# Patient Record
Sex: Male | Born: 1960 | Race: Black or African American | Hispanic: No | Marital: Single | Smoking: Never smoker
Health system: Southern US, Community
[De-identification: ages and names within clinical notes are randomized; demographics above are authoritative.]

## PROBLEM LIST (undated history)

## (undated) DIAGNOSIS — M25812 Other specified joint disorders, left shoulder: Secondary | ICD-10-CM

## (undated) DIAGNOSIS — N281 Cyst of kidney, acquired: Secondary | ICD-10-CM

## (undated) DIAGNOSIS — Z8673 Personal history of transient ischemic attack (TIA), and cerebral infarction without residual deficits: Secondary | ICD-10-CM

## (undated) DIAGNOSIS — I10 Essential (primary) hypertension: Secondary | ICD-10-CM

## (undated) DIAGNOSIS — Q2112 Patent foramen ovale: Secondary | ICD-10-CM

## (undated) DIAGNOSIS — N400 Enlarged prostate without lower urinary tract symptoms: Secondary | ICD-10-CM

## (undated) DIAGNOSIS — Q211 Atrial septal defect: Secondary | ICD-10-CM

## (undated) DIAGNOSIS — M1990 Unspecified osteoarthritis, unspecified site: Secondary | ICD-10-CM

## (undated) DIAGNOSIS — N183 Chronic kidney disease, stage 3 unspecified: Secondary | ICD-10-CM

## (undated) DIAGNOSIS — M75110 Incomplete rotator cuff tear or rupture of unspecified shoulder, not specified as traumatic: Secondary | ICD-10-CM

## (undated) DIAGNOSIS — E785 Hyperlipidemia, unspecified: Secondary | ICD-10-CM

## (undated) DIAGNOSIS — G4730 Sleep apnea, unspecified: Secondary | ICD-10-CM

## (undated) DIAGNOSIS — M7542 Impingement syndrome of left shoulder: Secondary | ICD-10-CM

## (undated) DIAGNOSIS — M109 Gout, unspecified: Secondary | ICD-10-CM

## (undated) DIAGNOSIS — J4520 Mild intermittent asthma, uncomplicated: Secondary | ICD-10-CM

## (undated) HISTORY — DX: Essential (primary) hypertension: I10

## (undated) HISTORY — DX: Sleep apnea, unspecified: G47.30

---

## 1982-07-28 HISTORY — PX: ABDOMINAL SURGERY: SHX537

## 2008-11-23 ENCOUNTER — Emergency Department (HOSPITAL_COMMUNITY): Admission: EM | Admit: 2008-11-23 | Discharge: 2008-11-23 | Payer: Self-pay | Admitting: Emergency Medicine

## 2009-08-14 ENCOUNTER — Encounter: Admission: RE | Admit: 2009-08-14 | Discharge: 2009-08-14 | Payer: Self-pay | Admitting: Internal Medicine

## 2009-08-22 ENCOUNTER — Emergency Department (HOSPITAL_BASED_OUTPATIENT_CLINIC_OR_DEPARTMENT_OTHER): Admission: EM | Admit: 2009-08-22 | Discharge: 2009-08-22 | Payer: Self-pay | Admitting: Emergency Medicine

## 2010-09-20 ENCOUNTER — Emergency Department (HOSPITAL_COMMUNITY): Payer: Self-pay

## 2010-09-20 ENCOUNTER — Inpatient Hospital Stay (HOSPITAL_COMMUNITY)
Admission: EM | Admit: 2010-09-20 | Discharge: 2010-09-23 | DRG: 066 | Disposition: A | Discharge status: Home / Self Care | Payer: Self-pay | Attending: Internal Medicine | Admitting: Internal Medicine

## 2010-09-20 DIAGNOSIS — I6350 Cerebral infarction due to unspecified occlusion or stenosis of unspecified cerebral artery: Principal | ICD-10-CM | POA: Diagnosis present

## 2010-09-20 DIAGNOSIS — Z7982 Long term (current) use of aspirin: Secondary | ICD-10-CM

## 2010-09-20 DIAGNOSIS — R29898 Other symptoms and signs involving the musculoskeletal system: Secondary | ICD-10-CM | POA: Diagnosis present

## 2010-09-20 LAB — DIFFERENTIAL
Basophils Absolute: 0 10*3/uL (ref 0.0–0.1)
Lymphocytes Relative: 52 % — ABNORMAL HIGH (ref 12–46)
Monocytes Relative: 8 % (ref 3–12)
Neutrophils Relative %: 37 % — ABNORMAL LOW (ref 43–77)

## 2010-09-20 LAB — CBC
MCV: 89.9 fL (ref 78.0–100.0)
RBC: 4.46 MIL/uL (ref 4.22–5.81)
RDW: 13.5 % (ref 11.5–15.5)
WBC: 5.5 10*3/uL (ref 4.0–10.5)

## 2010-09-20 LAB — BASIC METABOLIC PANEL
CO2: 25 mEq/L (ref 19–32)
Chloride: 103 mEq/L (ref 96–112)
Creatinine, Ser: 1.13 mg/dL (ref 0.4–1.5)
GFR calc Af Amer: >60 mL/min (ref >60)
Potassium: 3.6 mEq/L (ref 3.5–5.1)
Sodium: 137 mEq/L (ref 135–145)

## 2010-09-20 LAB — POCT CARDIAC MARKERS: Troponin i, poc: <0.05 ng/mL (ref 0.00–0.09)

## 2010-09-20 LAB — URINALYSIS, ROUTINE W REFLEX MICROSCOPIC
Bilirubin Urine: NEGATIVE
Hgb urine dipstick: NEGATIVE
Ketones, ur: NEGATIVE mg/dL
Protein, ur: NEGATIVE mg/dL
Specific Gravity, Urine: 1.021 (ref 1.005–1.030)
Urine Glucose, Fasting: NEGATIVE mg/dL
Urobilinogen, UA: 2 mg/dL — ABNORMAL HIGH (ref 0.0–1.0)
pH: 7 (ref 5.0–8.0)

## 2010-09-21 ENCOUNTER — Inpatient Hospital Stay (HOSPITAL_COMMUNITY): Payer: Self-pay

## 2010-09-21 LAB — COMPREHENSIVE METABOLIC PANEL
ALT: 21 U/L (ref 0–53)
Alkaline Phosphatase: 37 U/L — ABNORMAL LOW (ref 39–117)
Chloride: 107 mEq/L (ref 96–112)
GFR calc Af Amer: >60 mL/min (ref >60)
GFR calc non Af Amer: >60 mL/min (ref >60)
Potassium: 3.2 mEq/L — ABNORMAL LOW (ref 3.5–5.1)
Sodium: 141 mEq/L (ref 135–145)
Total Protein: 6.9 g/dL (ref 6.0–8.3)

## 2010-09-21 LAB — CBC
MCH: 32.3 pg (ref 26.0–34.0)
MCHC: 35.9 g/dL (ref 30.0–36.0)
RDW: 13.6 % (ref 11.5–15.5)

## 2010-09-21 LAB — CK TOTAL AND CKMB (NOT AT ARMC)
CK, MB: 3.8 ng/mL (ref 0.3–4.0)
Total CK: 500 U/L — ABNORMAL HIGH (ref 7–232)

## 2010-09-21 LAB — APTT: aPTT: 28 seconds (ref 24–37)

## 2010-09-21 LAB — LIPID PANEL: LDL Cholesterol: 102 mg/dL — ABNORMAL HIGH (ref 0–99)

## 2010-09-21 LAB — BASIC METABOLIC PANEL
Calcium: 9.2 mg/dL (ref 8.4–10.5)
Creatinine, Ser: 1.16 mg/dL (ref 0.4–1.5)
GFR calc Af Amer: >60 mL/min (ref >60)
GFR calc non Af Amer: >60 mL/min (ref >60)
Glucose, Bld: 119 mg/dL — ABNORMAL HIGH (ref 70–99)
Sodium: 139 mEq/L (ref 135–145)

## 2010-09-21 LAB — TROPONIN I: Troponin I: 0.01 ng/mL (ref 0.00–0.06)

## 2010-09-21 LAB — HEMOGLOBIN A1C: Hgb A1c MFr Bld: 5.7 % — ABNORMAL HIGH (ref <5.7)

## 2010-09-24 ENCOUNTER — Other Ambulatory Visit (HOSPITAL_COMMUNITY): Payer: Self-pay | Admitting: Internal Medicine

## 2010-09-30 NOTE — H&P (Signed)
[name], [name]               ACCOUNT NO.:  [id number]  MEDICAL RECORD NO.:  [id number]           PATIENT TYPE:  I  LOCATION:  3010                         FACILITY:  [facility]  PHYSICIAN:  Della Goo, M.D. DATE OF BIRTH:  Dec 07, 1960  DATE OF ADMISSION:  09/20/2010 DATE OF DISCHARGE:                             HISTORY & PHYSICAL   PRIMARY CARE PHYSICIAN:  None.  CHIEF COMPLAINT:  Dizziness.  HISTORY OF PRESENT ILLNESS:  This is a 50 year old male who presents to the emergency department second through the complaints of sudden onset of severe headaches and dizziness.  He reports he was at work when this happened and he was on break at the time.  The patient reports having dizziness along with ataxia and problems with his balance.  The patient was also reported having right-sided weakness.  PAST MEDICAL HISTORY:  None.  MEDICATIONS:  None.  ALLERGIES:  No known drug allergies.  SOCIAL HISTORY:  The patient is married.  He is nonsmoker and nondrinker and denies any illicit drug usage.  FAMILY HISTORY:  Negative for coronary artery disease, hypertension, diabetes, or cancer.  REVIEW OF SYSTEMS:  Pertinent as mentioned above.  PHYSICAL EXAMINATION FINDINGS:  GENERAL:  This is a 50 year old well- nourished, well-developed, African American male who is in no acute distress. VITAL SIGNS:  Temperature 97.9, blood pressure 143/83, heart rate 61, respirations 18, O2 sat 98%. HEENT:  Normocephalic, atraumatic.  There is no facial drooping.  Pupils are equally round and reactive to light bilaterally. CARDIOVASCULAR: Regular rate and rhythm.  No murmurs, gallops or rubs. LUNGS:  Clear to auscultation bilaterally.  No rales, no rhonchi and no wheezes. ABDOMEN:  Positive bowel sounds, soft, nontender, and nondistended.  No hepatosplenomegaly. EXTREMITIES:  Without cyanosis, clubbing or edema. NEUROLOGIC:  The patient is alert and oriented x3.  His cranial nerves are  intact.  There is no pronator drifting.  Bilateral grip strength symmetric and 5/5.  The patient is able to move all four of his extremities and there are no focal deficits on examination.  LABORATORY STUDIES:  White blood cell count 5.5, hemoglobin 14.4, hematocrit 40.1, MCV 89.9, platelets are 166, neutrophils 37%, lymphocytes 52%, which are negative.  Chemistry with a sodium of 137, potassium 3.6, chloride 103, CO2 25, BUN 17, creatinine 1.13, and glucose 95.  Urinalysis negative.  Urine drug screen negative.  X-ray reveals no acute disease process.  ECG reveals normal sinus rhythm.  No acute ST-segment changes.  CT scan of the brain without contrast reveals a right cerebellar infarct.  ASSESSMENT:  A 50 year old male being admitted with 1. Right cerebellar infarct. 2. Dizziness. 3. Ataxia.  PLAN:  The patient will be admitted to telemetry area for monitoring. Neurologic checks will be performed and cardiac enzymes.  CVA protocol has been initiated.  The patient will also have his fasting lipids checked.  Blood pressures also will be monitored, and physical therapy and occupational therapy consultations will be placed.     Della Goo, M.D.     HJ/MEDQ  D:  09/21/2010  T:  09/21/2010  Job:  [id number]  Electronically Signed by  Della Goo M.D. on 09/30/2010 04:54:09 PM

## 2010-10-03 NOTE — Discharge Summary (Signed)
[name], [name]               ACCOUNT NO.:  [id number]  MEDICAL RECORD NO.:  [id number]           PATIENT TYPE:  I  LOCATION:  3010                         FACILITY:  [facility]  PHYSICIAN:  Baltazar Najjar, MD     DATE OF BIRTH:  04/10/1961  DATE OF ADMISSION:  09/20/2010 DATE OF DISCHARGE:  09/23/2010                              DISCHARGE SUMMARY   FINAL DISCHARGE DIAGNOSES: 1. Acute right cerebellar infarct. 2. Ataxia/dizziness secondary to acute right cerebellar infarct.  CONSULTATION:  During this hospitalization, [facility].  The patient was seen by Dr. Anne Hahn.  RADIOLOGY/IMAGING: 1. MRI of brain done on September 21, 2010, showed 2 cm region of acute     infarction affecting the superior cerebellum on the right,     otherwise normal. 2. MRA of head showed no major vessel occlusion or correctable     proximal stenosis.  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 3. CT of head showed normal CT.  BRIEF ADMITTING HISTORY:  Please refer to the H and P for more details. On summary, Mr. [name] is a 50 year old African-American man with no significant past medical history presented with chief complaint of severe headache and dizziness while he was at work.  He also had right- sided weakness.  HOSPITAL COURSE:  The patient had MRI done in the ED that showed right cerebellar infarct.  He was admitted to the Neurology Service, was evaluated by Dr. Anne Hahn from Neurology, started on aspirin and Zocor. The patient was seen by PT, OT, and ST during this hospitalization. There was no need for PT, OT, or ST recommendation.  The patient regained his balance and there was no motor deficiency or rigidity noted.  He was seen by Dr. Anne Hahn today, who recommended outpatient transcranial Doppler bubble study and MRA of neck as an outpatient.   The patient had an echocardiogram, which was unremarkable; however, his carotid Doppler is still pending at the time of dictation.  The patient was seen and examined by me today and he is stable for discharge and eager to go home today.  We will discharge home.  If his carotid Doppler is negative, to follow with Dr. Anne Hahn for the rest of studies to be done as an outpatient.  DISCHARGE MEDICATIONS: 1. Aspirin 325 mg 1 tablet p.o. daily. 2. Zocor 20 mg p.o. daily.  DISCHARGE INSTRUCTIONS: 1. The patient to continue above medications as prescribed. 2. The patient to follow with his PCP within 1 week.  Follow with     Neurology Service as recommended by Dr. Earlene Plater in 2 weeks. 3. The patient to report any worsening of symptoms or any new symptoms     to his PCP or to come back to the ED.  CONDITION ON DISCHARGE:  Stable/improved.  Noted that the patient has no PCP and will request case manager to assign a PCP for him prior to discharge.          ______________________________ Baltazar Najjar,  MD     SA/MEDQ  D:  09/23/2010  T:  09/23/2010  Job:  [id number]  Electronically Signed by Hannah Beat MD on 10/02/2010 09:29:54 PM

## 2010-10-07 ENCOUNTER — Other Ambulatory Visit: Payer: Self-pay | Admitting: Neurology

## 2010-10-07 DIAGNOSIS — I6322 Cerebral infarction due to unspecified occlusion or stenosis of basilar arteries: Secondary | ICD-10-CM

## 2010-10-07 DIAGNOSIS — I6350 Cerebral infarction due to unspecified occlusion or stenosis of unspecified cerebral artery: Secondary | ICD-10-CM

## 2010-10-14 LAB — WOUND CULTURE: Gram Stain: NONE SEEN

## 2010-10-15 ENCOUNTER — Other Ambulatory Visit: Payer: Self-pay

## 2010-10-29 NOTE — Consult Note (Signed)
[name], [name]               ACCOUNT NO.:  [id number]  MEDICAL RECORD NO.:  [id number]           PATIENT TYPE:  I  LOCATION:  3010                         FACILITY:  [facility]  PHYSICIAN:  Marlan Palau, M.D.  DATE OF BIRTH:  09/08/1960  DATE OF CONSULTATION:  09/23/2010 DATE OF DISCHARGE:  09/23/2010                                CONSULTATION   REASON FOR CONSULTATION:  Dizziness, right-sided weakness, and right punctate cerebellar infarct on MRI.  NIH stroke scale 0.  Modified Rankin 0.  CT was not given as the patient had delay in arrival.  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  He went to the break room at approximately 5:30, sat down for a 15-minute break.  As he tried to get up, he noted that it was very hard for him to get his balance and he started to sway to the right.  As he tried to walk, he noted he has significant weakness in his right leg and continued to veer to the right.  He then tried to pick up a couple water with his right hand and noted that he had no strength in his right hand either.  He states that he walked along the wall after little while until coworker came.  He waited for approximately 1 hour before he went to his supervisor's office who then called EMS.  At the time of arrival he states EMS' blood pressure showed that he was systolically above 200 and diastolically in the mid 100s. The patient was brought immediately to [facility] ED where No Code Stroke was called and the patient was 2 hours out of initial symptoms. The patient states upon hitting the arrival of the ED, he continued to have to right arm tingling and right leg weakness.  It is not documented why code stroke was not called at that time as the patient was only 2 hours out of the window.  The patient states by the following day his symptoms had fully  resolved.  The patient was admitted on September 20, 2010.  Neurology was consulted on September 23, 2010.  At this time, the patient's symptoms have fully resolved.  The patient was on no medication at the time of arrival and has now been placed on aspirin and Zocor.  PAST MEDICAL HISTORY:  None.  STROKE RISK FACTORS:  Hypertension.  MEDICATIONS:  The patient was on none coming to the hospital; however, he is on aspirin 325 daily, Zocor 20 mg, and Zofran.  ALLERGIES:  No known drug allergies.  FAMILY HISTORY:  Negative for CAD, hypertension, diabetes, and cancer.  SOCIAL HISTORY:  He is married.  Does not smoke or drink.  REVIEW OF SYSTEMS:  Positive for right-sided weakness, dizziness, off balance, and hypertension.  PHYSICAL EXAMINATION:  VITAL SIGNS:  Temperature is 98.9, blood pressure is 136/85, heart rate 63, respirations 18. LUNGS:  Clear to auscultation bilaterally. NECK:  Negative bruits and supple. CARDIOVASCULAR:  Regular rate and rhythm. ABDOMEN:  Soft,  nontender, and nondistended. SKIN:  Warm, dry, and intact. NEUROLOGIC:  Pupils are equal, reactive, and accommodating.  Extraocular movements are intact.  Tongue is midline.  Face is equal, symmetrical. Speech is clear, well-enunciated.  Sensation is intact to pinprick, light touch, and vibration bilaterally.  Strength is 5/5 throughout with no drift on the upper or lower extremities.  Finger-nose, heel-to-shin were smooth.  Deep tendon reflexes were 1+ throughout.  Downgoing toes bilaterally.  Vision was intact to double simultaneous stimuli.  LABORATORY RESULTS:  Sodium 139, potassium 3.4, chloride 106, CO2 of 27, BUN is 15, creatinine 1.16, glucose 119.  White blood cell count 4.6, hemoglobin is 13.9, and hemoglobin 38.7, platelets 141.  PT is 13.8, INR is 1.07.  HbA1c is 5.7, cholesterol 148, triglycerides 92, HDL 28, LDL 102.  EKG is normal sinus rhythm.  CT head was negative for any mass or bleed.  MRI head  showed a 2-cm region of acute infarct in the right superior cerebellum.  MRA of the head showed possible stenosis right superior cerebellar artery at 2.5 cm beyond origin.  A 2-D echo and carotid Dopplers are pending.  ASSESSMENT:  This is a pleasant 50 year old male with sudden onset of right-sided weakness and unsteadiness, now showing a right cerebellar infarct.  Most likely etiology is hypertension with small vessel disease.  At this time, recommendations are, 1. Continue the patient on aspirin 325 mg daily. 2. Obtain carotid Doppler and 2-D echo as planned. 3. Continue with Zocor. 4. PT and OT. 5. Follow up with Dr. Pearlean Brownie in 2-3 months.  I will discuss this with Dr. Anne Hahn and make any changes that is needed.     Felicie Morn, PA-C   ______________________________ C. Lesia Sago, M.D.    DS/MEDQ  D:  09/23/2010  T:  09/23/2010  Job:  [id number]  Electronically Signed by Felicie Morn PA-C on 09/26/2010 01:27:18 PM Electronically Signed by Thana Farr M.D. on 10/29/2010 08:26:32 AM

## 2010-12-05 ENCOUNTER — Telehealth: Payer: Self-pay | Admitting: Cardiology

## 2010-12-05 NOTE — Telephone Encounter (Signed)
Per Dr. Pearlean Brownie with [facility], patient needed to be set up for a transesophageal echocardiogram. I have scheduled him for 12/09/10 @ 11:00am. I left a message for patient to call back and confirm appointment.

## 2010-12-05 NOTE — Telephone Encounter (Signed)
lvmtcb

## 2010-12-06 NOTE — Telephone Encounter (Signed)
Pt aware of his TEE appointment Monday morning @ 11:00am to be there at 10:00 am. He is aware to be NPO after midnight.

## 2010-12-07 ENCOUNTER — Emergency Department (HOSPITAL_BASED_OUTPATIENT_CLINIC_OR_DEPARTMENT_OTHER)
Admission: EM | Admit: 2010-12-07 | Discharge: 2010-12-07 | Disposition: A | Discharge status: Home / Self Care | Payer: Managed Care, Other (non HMO) | Attending: Emergency Medicine | Admitting: Emergency Medicine

## 2010-12-07 DIAGNOSIS — Z8679 Personal history of other diseases of the circulatory system: Secondary | ICD-10-CM | POA: Insufficient documentation

## 2010-12-07 DIAGNOSIS — E7800 Pure hypercholesterolemia, unspecified: Secondary | ICD-10-CM | POA: Insufficient documentation

## 2010-12-07 DIAGNOSIS — M7130 Other bursal cyst, unspecified site: Secondary | ICD-10-CM | POA: Insufficient documentation

## 2010-12-09 ENCOUNTER — Ambulatory Visit (HOSPITAL_COMMUNITY)
Admission: RE | Admit: 2010-12-09 | Discharge: 2010-12-09 | Disposition: A | Discharge status: Home / Self Care | Payer: Managed Care, Other (non HMO) | Source: Ambulatory Visit | Attending: Internal Medicine | Admitting: Internal Medicine

## 2010-12-09 DIAGNOSIS — Q2111 Secundum atrial septal defect: Secondary | ICD-10-CM | POA: Insufficient documentation

## 2010-12-09 DIAGNOSIS — Z8673 Personal history of transient ischemic attack (TIA), and cerebral infarction without residual deficits: Secondary | ICD-10-CM | POA: Insufficient documentation

## 2010-12-09 DIAGNOSIS — Q211 Atrial septal defect: Secondary | ICD-10-CM | POA: Insufficient documentation

## 2010-12-09 DIAGNOSIS — I6789 Other cerebrovascular disease: Secondary | ICD-10-CM

## 2010-12-09 HISTORY — PX: TRANSESOPHAGEAL ECHOCARDIOGRAM: SHX273

## 2010-12-11 ENCOUNTER — Telehealth: Payer: Self-pay | Admitting: Internal Medicine

## 2010-12-11 NOTE — Telephone Encounter (Signed)
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

## 2010-12-12 NOTE — Telephone Encounter (Signed)
LMOM for call back. 

## 2010-12-12 NOTE — Telephone Encounter (Signed)
[name] is returning your call.

## 2010-12-12 NOTE — Telephone Encounter (Signed)
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 She advised me that she had a copy of his discharge paperwork that states not to operate machinery that day. I advised her that this also means not to work that day. She verbalized understanding.

## 2010-12-13 ENCOUNTER — Telehealth: Payer: Self-pay | Admitting: Internal Medicine

## 2010-12-13 NOTE — Telephone Encounter (Signed)
Pt calling to get results from test on monday

## 2010-12-13 NOTE — Telephone Encounter (Signed)
Called patient back. He wanted to know what the next step is for him after having the TEE on Monday. Advised him per Dr.Ross to follow up with [facility]. He will call them on Monday.

## 2010-12-16 ENCOUNTER — Telehealth: Payer: Self-pay | Admitting: Internal Medicine

## 2010-12-16 NOTE — Telephone Encounter (Signed)
Pt Walked in Dropped off Attending Physician Forms for completion by Dr.P Tenny Craw, I will hold at my desk until Annice Pih comes in Office 12/17/10. 12/16/10/km

## 2010-12-17 ENCOUNTER — Telehealth: Payer: Self-pay | Admitting: Internal Medicine

## 2010-12-17 NOTE — Telephone Encounter (Signed)
Pt requesting fmla be faxed to [phone] att judy fields at [employer]

## 2010-12-19 NOTE — Telephone Encounter (Signed)
Unable to leave a message at cell or work phone because of lack of ID. 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  Rep from HR dept called back and I advised that Dr.Ross will not fill out FMLA paperwork because she only did a TEE on this patient. Form needs to be completed by Dr.Sethi at [facility].

## 2011-03-04 ENCOUNTER — Encounter: Payer: Self-pay | Admitting: *Deleted

## 2011-03-04 ENCOUNTER — Emergency Department (HOSPITAL_BASED_OUTPATIENT_CLINIC_OR_DEPARTMENT_OTHER)
Admission: EM | Admit: 2011-03-04 | Discharge: 2011-03-04 | Disposition: A | Discharge status: Home / Self Care | Payer: Managed Care, Other (non HMO) | Attending: Emergency Medicine | Admitting: Emergency Medicine

## 2011-03-04 DIAGNOSIS — R21 Rash and other nonspecific skin eruption: Secondary | ICD-10-CM | POA: Insufficient documentation

## 2011-03-04 DIAGNOSIS — T7840XA Allergy, unspecified, initial encounter: Secondary | ICD-10-CM

## 2011-03-04 DIAGNOSIS — Z8679 Personal history of other diseases of the circulatory system: Secondary | ICD-10-CM | POA: Insufficient documentation

## 2011-03-04 DIAGNOSIS — R220 Localized swelling, mass and lump, head: Secondary | ICD-10-CM | POA: Insufficient documentation

## 2011-03-04 MED ORDER — DIPHENHYDRAMINE HCL 50 MG/ML IJ SOLN
50.0000 mg | Freq: Once | INTRAMUSCULAR | Status: AC
  Administered 2011-03-04: 50 mg via INTRAVENOUS
  Filled 2011-03-04: qty 1

## 2011-03-04 MED ORDER — FAMOTIDINE IN NACL 20-0.9 MG/50ML-% IV SOLN
20.0000 mg | Freq: Once | INTRAVENOUS | Status: AC
  Administered 2011-03-04: 20 mg via INTRAVENOUS
  Filled 2011-03-04: qty 50

## 2011-03-04 MED ORDER — PREDNISONE 10 MG PO TABS: ORAL_TABLET | ORAL | Status: DC

## 2011-03-04 MED ORDER — DIPHENHYDRAMINE HCL 25 MG PO TABS: 25.0000 mg | ORAL_TABLET | Freq: Four times a day (QID) | ORAL | Status: AC

## 2011-03-04 MED ORDER — METHYLPREDNISOLONE SODIUM SUCC 125 MG IJ SOLR
125.0000 mg | Freq: Once | INTRAMUSCULAR | Status: AC
  Administered 2011-03-04: 125 mg via INTRAVENOUS
  Filled 2011-03-04: qty 2

## 2011-03-04 NOTE — ED Notes (Signed)
NO resp distress noted. Pt does have left sided upper lip swelling. Pt reports rash to arms and back x1 day. Wife states pt has been using a new laundry detergent for approx 1 week now. Pt denies any other known allergen.

## 2011-03-04 NOTE — ED Notes (Signed)
Pt alert and oriented. Denies difficulty breathing or airway tightening. DC instructions given to girlfriend, who verbalized understanding of continued care.

## 2011-03-04 NOTE — ED Notes (Signed)
Rash began earlier today to back/arms. Then lip swelling with tightening of throat began tonight approx 2-3hours ago. Has progressively worsened. Denies any known exposure to allergen.

## 2011-03-04 NOTE — ED Provider Notes (Addendum)
History    Scribed for Suzi Roots, MD, the patient was seen in room [facility]. This chart was scribed by Clarita Crane. This patient's care was started at 12:25PM.  CSN: [id number] Arrival date & time: 03/04/2011 12:14 AM  Chief Complaint  Patient presents with  . Allergic Reaction   HPI Patient is a 50 year old male c/o allergic reaction with new onset rash to bilateral upper extremities and back with associated pruritus this afternoon and moderate swelling of upper lip and throat tonight while at work. States swelling, rash and pruritus has been worsening since onset. Patient denies recent new exposures and states he is not currently taking any medications. Denies tongue swelling, SOB, wheezing, chest pain. No other complaints. Pt denies any new home or personal products. No change in foods, no shellfish. No personal or fam hx or facial or tongue swelling or angioedema. No medication use of any sort including bp meds. No fever or chills. No uri c/o. No recent viral or febrile illness.   PAST MEDICAL HISTORY:  Past Medical History  Diagnosis Date  . Stroke     PAST SURGICAL HISTORY:  History reviewed. No pertinent past surgical history.  MEDICATIONS:  Previous Medications   No medications on file     ALLERGIES:  Allergies as of 03/04/2011  . (No Known Allergies)     FAMILY HISTORY:  No family history on file.   SOCIAL HISTORY: History   Social History  . Marital Status: Single    Spouse Name: N/A    Number of Children: N/A  . Years of Education: N/A   Social History Main Topics  . Smoking status: Never Smoker   . Smokeless tobacco: None  . Alcohol Use: No  . Drug Use: No  . Sexually Active:    Other Topics Concern  . None   Social History Narrative  . None     Review of Systems 10 Systems reviewed and are negative for acute change except as noted in the HPI.  Physical Exam  BP 141/80  Pulse 73  Temp(Src) 99.7 F (37.6 C) (Oral)  Resp 18  Ht 6'  (1.829 m)  Wt 225 lb (102.059 kg)  BMI 30.52 kg/m2  SpO2 98%  Physical Exam  Nursing note and vitals reviewed. Constitutional: He is oriented to person, place, and time. He appears well-developed and well-nourished.  HENT:  Head: Normocephalic and atraumatic.       No swelling of tongue or throat appreciated. Moderate swelling of left side of upper lip.   Eyes: Conjunctivae are normal. Pupils are equal, round, and reactive to light.  Neck: Normal range of motion. Neck supple.  Cardiovascular: Normal rate, regular rhythm and normal heart sounds.  Exam reveals no gallop and no friction rub.   No murmur heard.      Hypertensive.   Pulmonary/Chest: Effort normal and breath sounds normal. He has no wheezes. He has no rales.       No stridor  Abdominal: Soft. There is no tenderness.  Musculoskeletal: Normal range of motion.  Neurological: He is alert and oriented to person, place, and time. No sensory deficit.  Skin: Skin is warm and dry.  Psychiatric: He has a normal mood and affect. His behavior is normal.    ED Course  Procedures  MDM Iv ns. Solumedrol iv, benadryl iv, and pepcid iv.   Observed in ed. Pt feels improved. No trouble breathing or swallowing. Lip swelling slowly improving. No throat swelling or edema  noted on recheck. Discussed plan for rx and allergy referral/follow up.     I personally performed the services described in this documentation, which was scribed in my presence. The recorded information has been reviewed and considered. Suzi Roots, MD    Suzi Roots, MD 03/04/11 [id number]  Suzi Roots, MD 03/04/11 [phone]

## 2011-03-04 NOTE — ED Notes (Signed)
Pt states the throat tightening has subsided "alot". sats 100% on RA. Lips remain swollen.

## 2011-09-25 ENCOUNTER — Encounter (HOSPITAL_BASED_OUTPATIENT_CLINIC_OR_DEPARTMENT_OTHER): Payer: Self-pay | Admitting: *Deleted

## 2011-09-25 DIAGNOSIS — J40 Bronchitis, not specified as acute or chronic: Secondary | ICD-10-CM | POA: Insufficient documentation

## 2011-09-25 DIAGNOSIS — R05 Cough: Secondary | ICD-10-CM | POA: Insufficient documentation

## 2011-09-25 DIAGNOSIS — R059 Cough, unspecified: Secondary | ICD-10-CM | POA: Insufficient documentation

## 2011-09-25 DIAGNOSIS — R062 Wheezing: Secondary | ICD-10-CM | POA: Insufficient documentation

## 2011-09-25 NOTE — ED Notes (Signed)
Pt states that he has had a nonproductive  cough x two weeks also reports dizziness with coughing

## 2011-09-26 ENCOUNTER — Emergency Department (INDEPENDENT_AMBULATORY_CARE_PROVIDER_SITE_OTHER)
Admit: 2011-09-26 | Discharge: 2011-09-26 | Disposition: A | Discharge status: Home / Self Care | Payer: Managed Care, Other (non HMO) | Attending: Emergency Medicine | Admitting: Emergency Medicine

## 2011-09-26 ENCOUNTER — Emergency Department (HOSPITAL_BASED_OUTPATIENT_CLINIC_OR_DEPARTMENT_OTHER)
Admission: EM | Admit: 2011-09-26 | Discharge: 2011-09-26 | Disposition: A | Discharge status: Home / Self Care | Payer: Managed Care, Other (non HMO) | Attending: Emergency Medicine | Admitting: Emergency Medicine

## 2011-09-26 DIAGNOSIS — J40 Bronchitis, not specified as acute or chronic: Secondary | ICD-10-CM

## 2011-09-26 DIAGNOSIS — R55 Syncope and collapse: Secondary | ICD-10-CM

## 2011-09-26 DIAGNOSIS — R059 Cough, unspecified: Secondary | ICD-10-CM

## 2011-09-26 DIAGNOSIS — R05 Cough: Secondary | ICD-10-CM

## 2011-09-26 DIAGNOSIS — R062 Wheezing: Secondary | ICD-10-CM

## 2011-09-26 DIAGNOSIS — R079 Chest pain, unspecified: Secondary | ICD-10-CM

## 2011-09-26 DIAGNOSIS — R0602 Shortness of breath: Secondary | ICD-10-CM

## 2011-09-26 MED ORDER — IPRATROPIUM BROMIDE 0.02 % IN SOLN
0.5000 mg | Freq: Once | RESPIRATORY_TRACT | Status: AC
  Administered 2011-09-26: 0.5 mg via RESPIRATORY_TRACT
  Filled 2011-09-26: qty 2.5

## 2011-09-26 MED ORDER — AZITHROMYCIN 250 MG PO TABS
500.0000 mg | ORAL_TABLET | Freq: Once | ORAL | Status: AC
  Administered 2011-09-26: 500 mg via ORAL
  Filled 2011-09-26: qty 2

## 2011-09-26 MED ORDER — PREDNISONE 50 MG PO TABS
60.0000 mg | ORAL_TABLET | Freq: Once | ORAL | Status: AC
  Administered 2011-09-26: 60 mg via ORAL
  Filled 2011-09-26: qty 1

## 2011-09-26 MED ORDER — PREDNISONE 20 MG PO TABS: 60.0000 mg | ORAL_TABLET | Freq: Every day | ORAL | Status: AC

## 2011-09-26 MED ORDER — IPRATROPIUM BROMIDE 0.02 % IN SOLN: 0.5000 mg | Freq: Once | RESPIRATORY_TRACT | Status: DC

## 2011-09-26 MED ORDER — AZITHROMYCIN 250 MG PO TABS: ORAL_TABLET | ORAL | Status: AC

## 2011-09-26 MED ORDER — ALBUTEROL SULFATE (5 MG/ML) 0.5% IN NEBU
5.0000 mg | INHALATION_SOLUTION | Freq: Once | RESPIRATORY_TRACT | Status: AC
  Administered 2011-09-26: 5 mg via RESPIRATORY_TRACT
  Filled 2011-09-26: qty 1

## 2011-09-26 MED ORDER — ALBUTEROL SULFATE HFA 108 (90 BASE) MCG/ACT IN AERS: 2.0000 | INHALATION_SPRAY | RESPIRATORY_TRACT | Status: DC | PRN

## 2011-09-26 MED ORDER — ALBUTEROL SULFATE (5 MG/ML) 0.5% IN NEBU: 5.0000 mg | INHALATION_SOLUTION | Freq: Once | RESPIRATORY_TRACT | Status: DC

## 2011-09-26 NOTE — ED Notes (Signed)
Pt. Alert and oriented, ambulatory gait steady, respirations even and regular, NAD noted

## 2011-09-26 NOTE — Discharge Instructions (Signed)
Take antibiotic as prescribed. Take prednisone as prescribed. Use albuterol inhaler 2 puffs every 4 hours as need for wheezing. Follow up with primary care doctor in the next few days. Return to ER if worse, trouble breathing, chest pain, weak/faint, other concern.    Bronchitis Bronchitis is the body's way of reacting to injury and/or infection (inflammation) of the bronchi. Bronchi are the air tubes that extend from the windpipe into the lungs. If the inflammation becomes severe, it may cause shortness of breath. CAUSES  Inflammation may be caused by:  A virus.   Germs (bacteria).   Dust.   Allergens.   Pollutants and many other irritants.  The cells lining the bronchial tree are covered with tiny hairs (cilia). These constantly beat upward, away from the lungs, toward the mouth. This keeps the lungs free of pollutants. When these cells become too irritated and are unable to do their job, mucus begins to develop. This causes the characteristic cough of bronchitis. The cough clears the lungs when the cilia are unable to do their job. Without either of these protective mechanisms, the mucus would settle in the lungs. Then you would develop pneumonia. Smoking is a common cause of bronchitis and can contribute to pneumonia. Stopping this habit is the single most important thing you can do to help yourself. TREATMENT   Your caregiver may prescribe an antibiotic if the cough is caused by bacteria. Also, medicines that open up your airways make it easier to breathe. Your caregiver may also recommend or prescribe an expectorant. It will loosen the mucus to be coughed up. Only take over-the-counter or prescription medicines for pain, discomfort, or fever as directed by your caregiver.   Removing whatever causes the problem (smoking, for example) is critical to preventing the problem from getting worse.   Cough suppressants may be prescribed for relief of cough symptoms.   Inhaled medicines may be  prescribed to help with symptoms now and to help prevent problems from returning.   For those with recurrent (chronic) bronchitis, there may be a need for steroid medicines.  SEEK IMMEDIATE MEDICAL CARE IF:   During treatment, you develop more pus-like mucus (purulent sputum).   You have a fever.   Your baby is older than 3 months with a rectal temperature of 102 F (38.9 C) or higher.   Your baby is 44 months old or younger with a rectal temperature of 100.4 F (38 C) or higher.   You become progressively more ill.   You have increased difficulty breathing, wheezing, or shortness of breath.  It is necessary to seek immediate medical care if you are elderly or sick from any other disease. MAKE SURE YOU:   Understand these instructions.   Will watch your condition.   Will get help right away if you are not doing well or get worse.  Document Released: 07/14/2005 Document Revised: 03/26/2011 Document Reviewed: 05/23/2008 [facility] Patient Information 2012 [place], [place].     Bronchospasm A bronchospasm is when the tubes that carry air in and out of your lungs (bronchioles) become smaller. It is hard to breathe when this happens. A bronchospasm can be caused by:  Asthma.   Allergies.   Lung infection.  HOME CARE   Do not  smoke. Avoid places that have secondhand smoke.   Dust your house often. Have your air ducts cleaned once or twice a year.   Find out what allergies may cause your bronchospasms.   Use your inhaler properly if you have  one. Know when to use it.   Eat healthy foods and drink plenty of water.   Only take medicine as told by your doctor.  GET HELP RIGHT AWAY IF:  You feel you cannot breathe or catch your breath.   You cannot stop coughing.   Your treatment is not helping you breathe better.  MAKE SURE YOU:   Understand these instructions.   Will watch your condition.   Will get help right away if you are not doing well or get worse.    Document Released: 05/11/2009 Document Revised: 03/26/2011 Document Reviewed: 05/11/2009 [facility] Patient Information 2012 [place], [place].

## 2011-09-26 NOTE — ED Provider Notes (Signed)
History     CSN: [id number]  Arrival date & time 09/25/11  2338   None     Chief Complaint  Patient presents with  . Cough    (Consider location/radiation/quality/duration/timing/severity/associated sxs/prior treatment) Patient is a 51 y.o. male presenting with cough. The history is provided by the patient and the spouse.  Cough Associated symptoms include wheezing. Pertinent negatives include no chest pain, no headaches, no sore throat, no shortness of breath and no eye redness.  pt with non prod cough x 2 weeks. States not getting any better. No known ill contacts. No sore throat, runny nose or other uri c/o. ?fever. No chills/sweats.+ hx bronchitis/pna.  No chest pain or discomfort. No orthopnea or pnd. No leg pain or swelling. No hx cad, chf, or pe. Denies any hx inhaler use, no hx asthma. Non smoker. Denies allergies. No recent new meds.   Past Medical History  Diagnosis Date  . Stroke     History reviewed. No pertinent past surgical history.  History reviewed. No pertinent family history.  History  Substance Use Topics  . Smoking status: Never Smoker   . Smokeless tobacco: Not on file  . Alcohol Use: No      Review of Systems  Constitutional: Negative for fever.  HENT: Negative for sore throat, neck pain and neck stiffness.   Eyes: Negative for redness.  Respiratory: Positive for cough and wheezing. Negative for shortness of breath.   Cardiovascular: Negative for chest pain and leg swelling.  Gastrointestinal: Negative for abdominal pain.  Genitourinary: Negative for flank pain.  Musculoskeletal: Negative for back pain.  Skin: Negative for rash.  Neurological: Negative for headaches.  Hematological: Does not bruise/bleed easily.  Psychiatric/Behavioral: Negative for confusion.    Allergies  Review of patient's allergies indicates no known allergies.  Home Medications   Current Outpatient Rx  Name Route Sig Dispense Refill  . PREDNISONE 10 MG PO TABS   Three (3) tablets po once a day for next 2 days      BP 155/84  Pulse 68  Temp(Src) 98.3 F (36.8 C) (Oral)  Resp 16  SpO2 94%  Physical Exam  Nursing note and vitals reviewed. Constitutional: He is oriented to person, place, and time. He appears well-developed and well-nourished. No distress.  HENT:  Head: Atraumatic.  Mouth/Throat: Oropharynx is clear and moist.  Eyes: Pupils are equal, round, and reactive to light.  Neck: Neck supple. No tracheal deviation present.  Cardiovascular: Normal rate, regular rhythm, normal heart sounds and intact distal pulses.  Exam reveals no gallop and no friction rub.   No murmur heard. Pulmonary/Chest: Effort normal. No accessory muscle usage. No respiratory distress. He has wheezes. He has no rales.  Abdominal: Soft. There is no tenderness.  Musculoskeletal: Normal range of motion. He exhibits no edema and no tenderness.  Neurological: He is alert and oriented to person, place, and time.  Skin: Skin is warm and dry.  Psychiatric: He has a normal mood and affect.    ED Course  Procedures (including critical care time)  Labs Reviewed - No data to display Dg Chest 2 View  09/26/2011  *RADIOLOGY REPORT*  Clinical Data: Mid chest pain and cough; shortness of breath. Syncope.  CHEST - 2 VIEW  Comparison: Chest radiograph performed 09/20/2010  Findings: The lungs are well-aerated.  Mild peribronchial thickening is noted.  There is no evidence of focal opacification, pleural effusion or pneumothorax.  The heart is normal in size; the mediastinal contour is within  normal limits.  No acute osseous abnormalities are seen.  IMPRESSION: Mild peribronchial thickening noted; lungs otherwise clear.  Original Report Authenticated By: Tonia Ghent, M.D.        MDM  Alb and atrovent neb. Hx bronchitis/pna. Will rx abx, albuterol.   Recheck persistent wheezing. Albuterol and atrovent neb. pred po.    Recheck post 3rd neb. Good air exchange. No increased  wob. Pulse ox 95% room air.     Suzi Roots, MD 09/26/11 [phone]

## 2011-09-26 NOTE — ED Notes (Signed)
Received pt. From triage, pt. Alert and oriented, NAD noted,

## 2011-11-15 ENCOUNTER — Encounter (HOSPITAL_COMMUNITY): Payer: Self-pay | Admitting: Emergency Medicine

## 2011-11-15 ENCOUNTER — Emergency Department (HOSPITAL_COMMUNITY): Payer: Managed Care, Other (non HMO)

## 2011-11-15 ENCOUNTER — Emergency Department (HOSPITAL_COMMUNITY)
Admission: EM | Admit: 2011-11-15 | Discharge: 2011-11-15 | Disposition: A | Discharge status: Home / Self Care | Payer: Managed Care, Other (non HMO) | Attending: Emergency Medicine | Admitting: Emergency Medicine

## 2011-11-15 DIAGNOSIS — Z8673 Personal history of transient ischemic attack (TIA), and cerebral infarction without residual deficits: Secondary | ICD-10-CM | POA: Insufficient documentation

## 2011-11-15 DIAGNOSIS — X58XXXA Exposure to other specified factors, initial encounter: Secondary | ICD-10-CM | POA: Insufficient documentation

## 2011-11-15 DIAGNOSIS — R220 Localized swelling, mass and lump, head: Secondary | ICD-10-CM | POA: Insufficient documentation

## 2011-11-15 DIAGNOSIS — S0003XA Contusion of scalp, initial encounter: Secondary | ICD-10-CM | POA: Insufficient documentation

## 2011-11-15 DIAGNOSIS — R51 Headache: Secondary | ICD-10-CM | POA: Insufficient documentation

## 2011-11-15 DIAGNOSIS — R221 Localized swelling, mass and lump, neck: Secondary | ICD-10-CM | POA: Insufficient documentation

## 2011-11-15 MED ORDER — METOCLOPRAMIDE HCL 5 MG/ML IJ SOLN
10.0000 mg | Freq: Once | INTRAMUSCULAR | Status: AC
  Administered 2011-11-15: 10 mg via INTRAVENOUS
  Filled 2011-11-15: qty 2

## 2011-11-15 MED ORDER — SODIUM CHLORIDE 0.9 % IV BOLUS (SEPSIS)
1000.0000 mL | Freq: Once | INTRAVENOUS | Status: AC
  Administered 2011-11-15: 1000 mL via INTRAVENOUS

## 2011-11-15 MED ORDER — DIPHENHYDRAMINE HCL 50 MG/ML IJ SOLN
25.0000 mg | Freq: Once | INTRAMUSCULAR | Status: AC
  Administered 2011-11-15: 25 mg via INTRAVENOUS
  Filled 2011-11-15: qty 1

## 2011-11-15 NOTE — ED Provider Notes (Signed)
History     CSN: [id number]  Arrival date & time 11/15/11  1009   First MD Initiated Contact with Patient 11/15/11 1056      Chief Complaint  Patient presents with  . Headache    Knot on head    (Consider location/radiation/quality/duration/timing/severity/associated sxs/prior treatment) Patient is a 51 y.o. male presenting with headaches. The history is provided by the patient.  Headache   He woke up this morning with a right sided occipital headache which he describes as a throbbing pain. It is moderately severe and he rates the pain at 5/10. Nothing makes it better nothing makes it worse. He denies visual change, nausea, vomiting, photophobia and phonophobia. He has not taken any medication for it. However, he noted when he touched his head around where the headache was located at its like a lump was there. He does not recall any trauma. He has a history of a prior stroke.  Past Medical History  Diagnosis Date  . Stroke     History reviewed. No pertinent past surgical history.  History reviewed. No pertinent family history.  History  Substance Use Topics  . Smoking status: Never Smoker   . Smokeless tobacco: Not on file  . Alcohol Use: No      Review of Systems  Neurological: Positive for headaches.  All other systems reviewed and are negative.    Allergies  Review of patient's allergies indicates no known allergies.  Home Medications   Current Outpatient Rx  Name Route Sig Dispense Refill  . ALBUTEROL SULFATE HFA 108 (90 BASE) MCG/ACT IN AERS Inhalation Inhale 2 puffs into the lungs every 4 (four) hours as needed. For wheezing.    . ASPIRIN EC 81 MG PO TBEC Oral Take 81 mg by mouth daily.      BP 158/77  Pulse 64  Temp(Src) 97.4 F (36.3 C) (Oral)  Resp 20  SpO2 96%  Physical Exam  Nursing note and vitals reviewed.  51 year old male who is resting comfortably and in no acute distress. Vital signs are significant for mild hypertension with blood  pressure 150/77. Oxygen saturation is 96% which is normal. Head is normocephalic and atraumatic. PERRLA, EOMI. Fundi show no hemorrhage, exudate, or papilledema. Palpation of the scalp shows no lump or mass. Neck is nontender and supple without adenopathy. Back is nontender. Lungs are clear without rales, wheezes, rhonchi. Heart has regular rate and rhythm without murmur. Abdomen is soft, flat, nontender without masses or hepatosplenomegaly. Extremities have no cyanosis or edema, full range of motion is present. Skin is warm and dry without rash. Neurologic: Mental status is normal, cranial nerves are intact, there are no focal motor or sensory deficits.  ED Course  Procedures (including critical care time)  Ct Head Wo Contrast  11/15/2011  *RADIOLOGY REPORT*  Clinical Data: 51 year old male with headache, dizziness, swelling at the top of the head.  CT HEAD WITHOUT CONTRAST  Technique:  Contiguous axial images were obtained from the base of the skull through the vertex without contrast.  Comparison: Head CT without contrast 09/21/2010.  Findings: The area of swelling is marked at the skin surface and there is an underlying broad-based scalp hematoma measuring up to 9 mm.  The calvarium remains intact.  Scalp soft tissues elsewhere within normal limits. Visualized orbit soft tissues are within normal limits.  Visualized paranasal sinuses and mastoids are clear.  No acute osseous abnormality identified.  Cerebral volume is within normal limits for age.  No midline shift, ventriculomegaly,  mass effect, evidence of mass lesion, intracranial hemorrhage or evidence of cortically based acute infarction.  Gray-white matter differentiation is within normal limits throughout the brain.  No suspicious intracranial vascular hyperdensity.  IMPRESSION: 1.  Right vertex scalp hematoma without underlying fracture. 2.  Stable and normal noncontrast CT appearance of the brain.  Original Report Authenticated By: Harley Hallmark,  M.D.   He got good relief of headache after IV fluid bolus, IV metoclopramide, and IV diphenhydramine.   1. Headache   2. Scalp hematoma       MDM  Headache which may be either migraine variant or muscle contraction headache. I do not see any evidence of actual mass but he will be sent for CT scan and will be given headache cocktail and reassessed.        Dione Booze, MD 11/15/11 1256

## 2011-11-15 NOTE — Discharge Instructions (Signed)
The swelling he noticed his from a area of bruising. Because you take aspirin, you're going to be more prone to bruising than someone who doesn't take aspirin. This is an unavoidable side effect. You should still take the aspirin because it is still giving you protection against future strokes.  Headache, General, Unknown Cause The specific cause of your headache may not have been found today. There are many causes and types of headache. A few common ones are:  Tension headache.   Migraine.   Infections (examples: dental and sinus infections).   Bone and/or joint problems in the neck or jaw.   Depression.   Eye problems.  These headaches are not life threatening.  Headaches can sometimes be diagnosed by a patient history and a physical exam. Sometimes, lab and imaging studies (such as x-ray and/or CT scan) are used to rule out more serious problems. In some cases, a spinal tap (lumbar puncture) may be requested. There are many times when your exam and tests may be normal on the first visit even when there is a serious problem causing your headaches. Because of that, it is very important to follow up with your doctor or local clinic for further evaluation. FINDING OUT THE RESULTS OF TESTS  If a radiology test was performed, a radiologist will review your results.   You will be contacted by the emergency department or your physician if any test results require a change in your treatment plan.   Not all test results may be available during your visit. If your test results are not back during the visit, make an appointment with your caregiver to find out the results. Do not assume everything is normal if you have not heard from your caregiver or the medical facility. It is important for you to follow up on all of your test results.  HOME CARE INSTRUCTIONS   Keep follow-up appointments with your caregiver, or any specialist referral.   Only take over-the-counter or prescription medicines for  pain, discomfort, or fever as directed by your caregiver.   Biofeedback, massage, or other relaxation techniques may be helpful.   Ice packs or heat applied to the head and neck can be used. Do this three to four times per day, or as needed.   Call your doctor if you have any questions or concerns.   If you smoke, you should quit.  SEEK MEDICAL CARE IF:   You develop problems with medications prescribed.   You do not respond to or obtain relief from medications.   You have a change from the usual headache.   You develop nausea or vomiting.  SEEK IMMEDIATE MEDICAL CARE IF:   If your headache becomes severe.   You have an unexplained oral temperature above 102 F (38.9 C), or as your caregiver suggests.   You have a stiff neck.   You have loss of vision.   You have muscular weakness.   You have loss of muscular control.   You develop severe symptoms different from your first symptoms.   You start losing your balance or have trouble walking.   You feel faint or pass out.  MAKE SURE YOU:   Understand these instructions.   Will watch your condition.   Will get help right away if you are not doing well or get worse.  Document Released: 07/14/2005 Document Revised: 07/03/2011 Document Reviewed: 03/02/2008 [facility] Patient Information 2012 [place], [place].

## 2011-11-15 NOTE — ED Notes (Signed)
Patient transported to CT 

## 2011-11-15 NOTE — ED Notes (Signed)
Pt reports woke up this morning and noticed a lump on his head. Pt also reports dizziness.

## 2012-03-26 ENCOUNTER — Emergency Department (HOSPITAL_BASED_OUTPATIENT_CLINIC_OR_DEPARTMENT_OTHER)
Admission: EM | Admit: 2012-03-26 | Discharge: 2012-03-26 | Disposition: A | Discharge status: Home / Self Care | Payer: Managed Care, Other (non HMO) | Attending: Emergency Medicine | Admitting: Emergency Medicine

## 2012-03-26 ENCOUNTER — Emergency Department (HOSPITAL_BASED_OUTPATIENT_CLINIC_OR_DEPARTMENT_OTHER): Payer: Managed Care, Other (non HMO)

## 2012-03-26 ENCOUNTER — Encounter (HOSPITAL_BASED_OUTPATIENT_CLINIC_OR_DEPARTMENT_OTHER): Payer: Self-pay | Admitting: *Deleted

## 2012-03-26 DIAGNOSIS — R0602 Shortness of breath: Secondary | ICD-10-CM | POA: Insufficient documentation

## 2012-03-26 DIAGNOSIS — R059 Cough, unspecified: Secondary | ICD-10-CM | POA: Insufficient documentation

## 2012-03-26 DIAGNOSIS — I498 Other specified cardiac arrhythmias: Secondary | ICD-10-CM | POA: Insufficient documentation

## 2012-03-26 DIAGNOSIS — J9801 Acute bronchospasm: Secondary | ICD-10-CM | POA: Insufficient documentation

## 2012-03-26 DIAGNOSIS — R05 Cough: Secondary | ICD-10-CM | POA: Insufficient documentation

## 2012-03-26 DIAGNOSIS — Z8673 Personal history of transient ischemic attack (TIA), and cerebral infarction without residual deficits: Secondary | ICD-10-CM | POA: Insufficient documentation

## 2012-03-26 LAB — CBC WITH DIFFERENTIAL/PLATELET
Eosinophils Absolute: 0.4 10*3/uL (ref 0.0–0.7)
Eosinophils Relative: 5 % (ref 0–5)
HCT: 39.6 % (ref 39.0–52.0)
Hemoglobin: 14.3 g/dL (ref 13.0–17.0)
Lymphs Abs: 4.1 10*3/uL — ABNORMAL HIGH (ref 0.7–4.0)
MCH: 32 pg (ref 26.0–34.0)
MCHC: 36.1 g/dL — ABNORMAL HIGH (ref 30.0–36.0)
MCV: 88.6 fL (ref 78.0–100.0)
Monocytes Absolute: 0.6 10*3/uL (ref 0.1–1.0)
Monocytes Relative: 7 % (ref 3–12)
Neutrophils Relative %: 36 % — ABNORMAL LOW (ref 43–77)
RBC: 4.47 MIL/uL (ref 4.22–5.81)

## 2012-03-26 LAB — BASIC METABOLIC PANEL
BUN: 17 mg/dL (ref 6–23)
Creatinine, Ser: 1.2 mg/dL (ref 0.50–1.35)
GFR calc non Af Amer: 69 mL/min — ABNORMAL LOW (ref >90)
Glucose, Bld: 106 mg/dL — ABNORMAL HIGH (ref 70–99)
Potassium: 3.5 mEq/L (ref 3.5–5.1)

## 2012-03-26 MED ORDER — ALBUTEROL SULFATE (5 MG/ML) 0.5% IN NEBU
5.0000 mg | INHALATION_SOLUTION | Freq: Once | RESPIRATORY_TRACT | Status: AC
  Administered 2012-03-26: 5 mg via RESPIRATORY_TRACT
  Filled 2012-03-26: qty 1

## 2012-03-26 MED ORDER — PREDNISONE 20 MG PO TABS: 60.0000 mg | ORAL_TABLET | Freq: Every day | ORAL | Status: AC

## 2012-03-26 MED ORDER — ALBUTEROL SULFATE HFA 108 (90 BASE) MCG/ACT IN AERS
2.0000 | INHALATION_SPRAY | RESPIRATORY_TRACT | Status: DC | PRN
  Administered 2012-03-26: 2 via RESPIRATORY_TRACT
  Filled 2012-03-26: qty 6.7

## 2012-03-26 MED ORDER — ALBUTEROL SULFATE (5 MG/ML) 0.5% IN NEBU
2.5000 mg | INHALATION_SOLUTION | RESPIRATORY_TRACT | Status: DC
  Administered 2012-03-26: 2.5 mg via RESPIRATORY_TRACT
  Filled 2012-03-26: qty 0.5

## 2012-03-26 MED ORDER — PREDNISONE 50 MG PO TABS
60.0000 mg | ORAL_TABLET | Freq: Once | ORAL | Status: AC
  Administered 2012-03-26: 60 mg via ORAL
  Filled 2012-03-26: qty 1

## 2012-03-26 MED ORDER — ALBUTEROL SULFATE HFA 108 (90 BASE) MCG/ACT IN AERS: 1.0000 | INHALATION_SPRAY | RESPIRATORY_TRACT | Status: DC | PRN

## 2012-03-26 MED ORDER — IPRATROPIUM BROMIDE 0.02 % IN SOLN
0.5000 mg | RESPIRATORY_TRACT | Status: DC
  Administered 2012-03-26: 0.5 mg via RESPIRATORY_TRACT
  Filled 2012-03-26: qty 2.5

## 2012-03-26 NOTE — Patient Instructions (Signed)
Instructed pt on the proper use of administering albuteral mdi via aerochamber pt tolerated well 

## 2012-03-26 NOTE — ED Notes (Signed)
Sob x 3 weeks. Does not appear sob at triage. Non productive cough.

## 2012-03-27 NOTE — ED Provider Notes (Signed)
History     CSN: [id number]  Arrival date & time 03/26/12  1936   First MD Initiated Contact with Patient 03/26/12 2010      Chief Complaint  Patient presents with  . Shortness of Breath    (Consider location/radiation/quality/duration/timing/severity/associated sxs/prior treatment) HPI Patient is a 51 yo male who presents complaining of 3 weeks of shortness of breath that is worse with exertion and accompanied by non-productive cough.  Patient denies fevers or sick contacts.  He denies any chest pain at all.  Patient has used albuterol and prednisone in the past for similar symptoms.  Patient has history of stroke 2 years ago but has never had other problems with CAD.  He has known PFO and takes ASA for this.  Rest seems to help somewhat with symptoms.  There are no other associated or modifying factors.  He denies h/o HTN, HLD, CAD, DM, or tobacco use.  Past Medical History  Diagnosis Date  . Stroke     History reviewed. No pertinent past surgical history.  History reviewed. No pertinent family history.  History  Substance Use Topics  . Smoking status: Never Smoker   . Smokeless tobacco: Not on file  . Alcohol Use: No      Review of Systems  Constitutional: Negative.   HENT: Negative.   Eyes: Negative.   Respiratory: Positive for cough and shortness of breath.   Cardiovascular: Negative.   Gastrointestinal: Negative.   Genitourinary: Negative.   Musculoskeletal: Negative.   Skin: Negative.   Neurological: Negative.   Hematological: Negative.   Psychiatric/Behavioral: Negative.   All other systems reviewed and are negative.    Allergies  Review of patient's allergies indicates no known allergies.  Home Medications   Current Outpatient Rx  Name Route Sig Dispense Refill  . ALBUTEROL SULFATE HFA 108 (90 BASE) MCG/ACT IN AERS Inhalation Inhale 2 puffs into the lungs every 4 (four) hours as needed. For wheezing.    . ALBUTEROL SULFATE HFA 108 (90 BASE)  MCG/ACT IN AERS Inhalation Inhale 1-2 puffs into the lungs every 4 (four) hours as needed for wheezing. 1 Inhaler 1  . ASPIRIN EC 81 MG PO TBEC Oral Take 81 mg by mouth daily.    Marland Kitchen PREDNISONE 20 MG PO TABS Oral Take 3 tablets (60 mg total) by mouth daily. 15 tablet 0    BP 144/78  Pulse 56  Temp 98.6 F (37 C) (Oral)  Resp 14  Ht 6' (1.829 m)  Wt 225 lb (102.059 kg)  BMI 30.52 kg/m2  SpO2 95%  Physical Exam  Nursing note and vitals reviewed. GEN: Well-developed, well-nourished male in no distress HEENT: Atraumatic, normocephalic. Oropharynx clear without erythema EYES: PERRLA BL, no scleral icterus. NECK: Trachea midline, no meningismus CV: regular rate and rhythm. No murmurs, rubs, or gallops PULM: No respiratory distress.  No crackles, wheezes, or rales.Diminished throughout GI: soft, non-tender. No guarding, rebound, or tenderness. + bowel sounds  GU: deferred Neuro: cranial nerves grossly 2-12 intact, no abnormalities of strength or sensation, A and O x 3 MSK: Patient moves all 4 extremities symmetrically, no deformity, edema, or injury noted Skin: No rashes petechiae, purpura, or jaundice Psych: no abnormality of mood   ED Course  Procedures (including critical care time)  Indication: shortness of breath Please note this EKG was reviewed extemporaneously by myself.   Date: 03/26/2012  Rate: 57  Rhythm: sinus bradycardia  QRS Axis: normal  Intervals: normal  ST/T Wave abnormalities: nonspecific T wave changes  Conduction Disutrbances:none  Narrative Interpretation:   Old EKG Reviewed: unchanged      Labs Reviewed  CBC WITH DIFFERENTIAL - Abnormal; Notable for the following:    MCHC 36.1 (*)     Neutrophils Relative 36 (*)     Lymphocytes Relative 52 (*)     Lymphs Abs 4.1 (*)     All other components within normal limits  BASIC METABOLIC PANEL - Abnormal; Notable for the following:    Glucose, Bld 106 (*)     GFR calc non Af Amer 69 (*)     GFR calc Af  Amer 80 (*)     All other components within normal limits   Dg Chest 2 View  03/26/2012  *RADIOLOGY REPORT*  Clinical Data: Increased shortness of breath  CHEST - 2 VIEW  Comparison: 09/26/2011  Findings: Borderline enlargement of cardiac silhouette. Tortuous aorta. Minimal pulmonary vascular congestion. Mild chronic peribronchial thickening. No infiltrate, pleural effusion or pneumothorax. No acute osseous findings.  IMPRESSION: Mild chronic bronchitic changes. No acute abnormalities.   Original Report Authenticated By: Lollie Marrow, M.D.      1. Bronchospasm       MDM  Patient was evaluated by myself.  Patient did not have any CP and had no RFs for CAD other than age.  Patient had unremarkable ECG, CXR with no signs of pulmonary edema or infectious process, CBC without leukocytosis or anemia, and normal renal panel.  Symptoms improved signiifcantly with duoneb and an additional albuterol neb and prednisone were given.  Patient was discharged with Rx for both and an albuterol inhaler.  He plans to seek follow-up with a PCP and he will contact his insurance provider to get a list of network providers.  Patient was discharged in good condition.        Cyndra Numbers, MD 03/27/12 1021

## 2012-09-01 ENCOUNTER — Other Ambulatory Visit (HOSPITAL_COMMUNITY): Payer: Self-pay | Admitting: Cardiology

## 2012-09-01 DIAGNOSIS — R079 Chest pain, unspecified: Secondary | ICD-10-CM

## 2012-09-02 DIAGNOSIS — I639 Cerebral infarction, unspecified: Secondary | ICD-10-CM | POA: Insufficient documentation

## 2012-09-03 ENCOUNTER — Encounter: Payer: Self-pay | Admitting: *Deleted

## 2012-09-03 ENCOUNTER — Encounter: Payer: Self-pay | Admitting: Internal Medicine

## 2012-09-03 ENCOUNTER — Ambulatory Visit (INDEPENDENT_AMBULATORY_CARE_PROVIDER_SITE_OTHER): Payer: Managed Care, Other (non HMO) | Admitting: Internal Medicine

## 2012-09-03 VITALS — BP 142/90 | HR 69 | Temp 98.4°F | Ht 72.0 in | Wt 228.8 lb

## 2012-09-03 DIAGNOSIS — R0602 Shortness of breath: Secondary | ICD-10-CM

## 2012-09-03 DIAGNOSIS — J45909 Unspecified asthma, uncomplicated: Secondary | ICD-10-CM | POA: Insufficient documentation

## 2012-09-03 MED ORDER — PREDNISONE (PAK) 10 MG PO TABS: ORAL_TABLET | ORAL | Status: DC

## 2012-09-03 MED ORDER — PANTOPRAZOLE SODIUM 40 MG PO TBEC: 40.0000 mg | DELAYED_RELEASE_TABLET | Freq: Every day | ORAL | Status: DC

## 2012-09-03 MED ORDER — MOMETASONE FURO-FORMOTEROL FUM 100-5 MCG/ACT IN AERO: INHALATION_SPRAY | RESPIRATORY_TRACT | Status: DC

## 2012-09-03 MED ORDER — FAMOTIDINE 20 MG PO TABS: ORAL_TABLET | ORAL | Status: DC

## 2012-09-03 NOTE — Assessment & Plan Note (Signed)
-   hfa 75% 09/03/2012  - 09/03/2012 Spirometry FEV1 1.41(40%) ratio 79 with minimal curvature to f/v loop  Atypical asthma hx and f/v loop ? Could this be upper airway cough syndrome/ vcd masquerading as asthma with partial response to prednisone?    The proper method of use, as well as anticipated side effects, of a metered-dose inhaler are discussed and demonstrated to the patient. Improved effectiveness after extensive coaching during this visit to a level of approximately  75% so try low dose dulera 2 bid and max rx for GERD / Genella Rife diet then regroup with full pft's in 6 weeks

## 2012-09-03 NOTE — Patient Instructions (Addendum)
Start dulera 100 Take 2 puffs first thing in am and then another 2 puffs about 12 hours later.    Only use your albuterol (proventil) as a rescue medication to be used if you can't catch your breath by resting or doing a relaxed purse lip breathing pattern. The less you use it, the better it will work when you need it.  Ok to use it up to every 4 hours if needed, but our goal is less than twice weekly  Work on inhaler technique:  relax and gently blow all the way out then take a nice smooth deep breath back in, triggering the inhaler at same time you start breathing in.  Hold for up to 5 seconds if you can.  Rinse and gargle with water when done   If your mouth or throat starts to bother you,   I suggest you time the inhaler to your dental care and after using the inhaler(s) brush teeth and tongue with a baking soda containing toothpaste and when you rinse this out, gargle with it first to see if this helps your mouth and throat.     Try protonix (pantoprazole) Take 30-60 min before first meal of the day and Pepcid 20 mg one bedtime until  Return  GERD (REFLUX)  is an extremely common cause of respiratory symptoms, many times with no significant heartburn at all.    It can be treated with medication, but also with lifestyle changes including avoidance of late meals, excessive alcohol, smoking cessation, and avoid fatty foods, chocolate, peppermint, colas, red wine, and acidic juices such as orange juice.  NO MINT OR MENTHOL PRODUCTS SO NO COUGH DROPS  USE SUGARLESS CANDY INSTEAD (jolley ranchers or Stover's)  NO OIL BASED VITAMINS - use powdered substitutes.  If not improving to your satisfaction,  Prednisone 10 mg take  4 each am x 2 days,   2 each am x 2 days,  1 each am x2days and stop    Please schedule a follow up office visit in 6 weeks, call sooner if needed with pft's

## 2012-09-03 NOTE — Progress Notes (Signed)
  Subjective:    Patient ID: [name], male    DOB: 1960/07/31  MRN: [id number]  HPI  69 yobm never smoker with cva 2011 but no resp problems until 2013  Referred 09/04/2012 to pulmonary clinic by Jeni Salles for eval of refractory sob.   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  Present 24 h per day, no different over weekends, never away from work for more than 2 days.  No obvious daytime variabilty cp or chest tightness, subjective wheeze overt sinus or hb symptoms. No unusual exp hx or h/o childhood pna/ asthma or premature birth to his knowledge.    Also denies any obvious fluctuation of symptoms with weather or environmental changes or other aggravating or alleviating factors except as outlined above      Review of Systems  Constitutional: Negative for fever and unexpected weight change.  HENT: Positive for sore throat. Negative for ear pain, nosebleeds, congestion, rhinorrhea, sneezing, trouble swallowing, dental problem, postnasal drip and sinus pressure.   Eyes: Negative for redness and itching.  Respiratory: Positive for cough, shortness of breath and wheezing. Negative for chest tightness.   Cardiovascular: Negative for palpitations and leg swelling.  Gastrointestinal: Negative for nausea and vomiting.  Genitourinary: Negative for dysuria.  Musculoskeletal: Negative for joint swelling.  Skin: Negative for rash.  Neurological: Negative for headaches.  Hematological: Does not bruise/bleed easily.  Psychiatric/Behavioral: Negative for dysphoric mood. The patient is not nervous/anxious.        Objective:   Physical Exam Hoarse amb bm clearing throat 1 h p last albuterol  Wt Readings from Last 3 Encounters:  09/03/12 228 lb 12.8 oz (103.783 kg)  03/26/12 225 lb (102.059 kg)  03/04/11 225  lb (102.059 kg)    HEENT: nl dentition, turbinates, and orophanx. Nl external ear canals without cough reflex   NECK :  without JVD/Nodes/TM/ nl carotid upstrokes bilaterally   LUNGS: no acc muscle use,  insp and exp rhonchi with poor air movement.   CV:  RRR  no s3 or murmur or increase in P2, no edema   ABD:  soft and nontender with nl excursion in the supine position. No bruits or organomegaly, bowel sounds nl  MS:  warm without deformities, calf tenderness, cyanosis or clubbing  SKIN: warm and dry without lesions    NEURO:  alert, approp, no deficits   cxr @ [facility] 2 days prior to OV  Reported to be clear         Assessment & Plan:

## 2012-09-08 ENCOUNTER — Emergency Department (HOSPITAL_COMMUNITY)
Admission: EM | Admit: 2012-09-08 | Discharge: 2012-09-08 | Disposition: A | Discharge status: Home / Self Care | Payer: Managed Care, Other (non HMO) | Attending: Emergency Medicine | Admitting: Emergency Medicine

## 2012-09-08 ENCOUNTER — Emergency Department (HOSPITAL_COMMUNITY): Payer: Managed Care, Other (non HMO)

## 2012-09-08 ENCOUNTER — Encounter (HOSPITAL_COMMUNITY): Payer: Self-pay | Admitting: Emergency Medicine

## 2012-09-08 DIAGNOSIS — Z79899 Other long term (current) drug therapy: Secondary | ICD-10-CM | POA: Insufficient documentation

## 2012-09-08 DIAGNOSIS — G4730 Sleep apnea, unspecified: Secondary | ICD-10-CM | POA: Insufficient documentation

## 2012-09-08 DIAGNOSIS — M25562 Pain in left knee: Secondary | ICD-10-CM

## 2012-09-08 DIAGNOSIS — Z8709 Personal history of other diseases of the respiratory system: Secondary | ICD-10-CM | POA: Insufficient documentation

## 2012-09-08 DIAGNOSIS — IMO0002 Reserved for concepts with insufficient information to code with codable children: Secondary | ICD-10-CM | POA: Insufficient documentation

## 2012-09-08 DIAGNOSIS — M79609 Pain in unspecified limb: Secondary | ICD-10-CM

## 2012-09-08 DIAGNOSIS — M7989 Other specified soft tissue disorders: Secondary | ICD-10-CM

## 2012-09-08 DIAGNOSIS — Z8673 Personal history of transient ischemic attack (TIA), and cerebral infarction without residual deficits: Secondary | ICD-10-CM | POA: Insufficient documentation

## 2012-09-08 DIAGNOSIS — R229 Localized swelling, mass and lump, unspecified: Secondary | ICD-10-CM | POA: Insufficient documentation

## 2012-09-08 DIAGNOSIS — M25569 Pain in unspecified knee: Secondary | ICD-10-CM | POA: Insufficient documentation

## 2012-09-08 DIAGNOSIS — Z7982 Long term (current) use of aspirin: Secondary | ICD-10-CM | POA: Insufficient documentation

## 2012-09-08 MED ORDER — OXYCODONE-ACETAMINOPHEN 5-325 MG PO TABS: 2.0000 | ORAL_TABLET | ORAL | Status: DC | PRN

## 2012-09-08 MED ORDER — OXYCODONE-ACETAMINOPHEN 5-325 MG PO TABS
2.0000 | ORAL_TABLET | Freq: Once | ORAL | Status: AC
  Administered 2012-09-08: 2 via ORAL
  Filled 2012-09-08: qty 2

## 2012-09-08 NOTE — ED Notes (Signed)
Patient transported to X-ray 

## 2012-09-08 NOTE — ED Provider Notes (Signed)
History     CSN: [id number]  Arrival date & time 09/08/12  1024   First MD Initiated Contact with Patient 09/08/12 1033      Chief Complaint  Patient presents with  . Joint Swelling    24 hx of knee swelling  . Knee Pain    (Consider location/radiation/quality/duration/timing/severity/associated sxs/prior treatment) HPI Comments: Patient is a 52 year old male who presents with left knee pain that started suddenly yesterday. Patient denies injury. The pain is sharp and throbbing and severe. The pain does not radiate and is located in left popliteal region. He has not tried anything for pain. Walking makes the pain worse. Nothing makes the pain better. No associated symptoms.    Past Medical History  Diagnosis Date  . Stroke   . Sleep apnea   . Shortness of breath     Past Surgical History  Procedure Laterality Date  . Abdominal surgery      Family History  Problem Relation Age of Onset  . Diabetes Mother   . Diabetes Sister   . Hypertension Mother     History  Substance Use Topics  . Smoking status: Never Smoker   . Smokeless tobacco: Never Used  . Alcohol Use: No      Review of Systems  Musculoskeletal: Positive for joint swelling and arthralgias.  All other systems reviewed and are negative.    Allergies  Review of patient's allergies indicates no known allergies.  Home Medications   Current Outpatient Rx  Name  Route  Sig  Dispense  Refill  . albuterol (PROVENTIL HFA;VENTOLIN HFA) 108 (90 BASE) MCG/ACT inhaler   Inhalation   Inhale 2 puffs into the lungs every 4 (four) hours as needed. For wheezing.         Marland Kitchen aspirin EC 81 MG tablet   Oral   Take 81 mg by mouth daily.         . famotidine (PEPCID) 20 MG tablet      One at bedtime   30 tablet   2   . mometasone-formoterol (DULERA) 100-5 MCG/ACT AERO      Take 2 puffs first thing in am and then another 2 puffs about 12 hours later.   1 Inhaler   11   . pantoprazole (PROTONIX) 40 MG  tablet   Oral   Take 1 tablet (40 mg total) by mouth daily. Take 30-60 min before first meal of the day   30 tablet   2   . predniSONE (STERAPRED UNI-PAK) 10 MG tablet   Oral   Take 10 mg by mouth 2 (two) times daily. 1 each am x2days and stop           BP 157/74  Pulse 67  Temp(Src) 97.7 F (36.5 C) (Oral)  Resp 16  SpO2 100%  Physical Exam  Nursing note and vitals reviewed. Constitutional: He appears well-developed and well-nourished. No distress.  HENT:  Head: Normocephalic and atraumatic.  Eyes: Conjunctivae are normal.  Cardiovascular: Normal rate and regular rhythm.  Exam reveals no gallop and no friction rub.   No murmur heard. Pulmonary/Chest: Effort normal and breath sounds normal. He has no wheezes. He has no rales. He exhibits no tenderness.  Abdominal: Soft. There is no tenderness.  Musculoskeletal: Normal range of motion.  Left popliteal tenderness to palpation. No lower extremity edema noted.   Neurological: He is alert.  Speech is goal-oriented. Moves limbs without ataxia.   Skin: Skin is warm and dry.  ED Course  Procedures (including critical care time)  Labs Reviewed - No data to display Dg Knee Complete 4 Views Left  09/08/2012  *RADIOLOGY REPORT*  Clinical Data: Joint swelling.  Knee pain.  LEFT KNEE - COMPLETE 4+ VIEW  Comparison: No priors.  Findings: Four views of the left knee demonstrate no acute displaced fracture, subluxation or dislocation.  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  Additionally, there are extensive enthesopathic changes from the inferior aspect of the patella, suggesting chronic patellar tendinopathy.  IMPRESSION: 1.  Negative for acute fracture. 2.  Chronic degenerative changes, as above, without acute abnormality.   Original Report Authenticated By: Trudie Reed, M.D.      1. Knee pain, acute, left       MDM  1:17 PM Xray unremarkable. US done to  evaluate for baker's cyst. Doppler unremarkable. Patient will be discharged without further evaluation.         Emilia Beck, PA-C 09/08/12 1630

## 2012-09-08 NOTE — Progress Notes (Signed)
Left:  No evidence of DVT, superficial thrombosis, or Baker's cyst.

## 2012-09-08 NOTE — ED Notes (Signed)
Pt reports severe l/knee pain and swelling x 24 hrs. Denies hx of same. Denies trauma

## 2012-09-08 NOTE — ED Notes (Signed)
Doppler completed.

## 2012-09-09 ENCOUNTER — Encounter (HOSPITAL_COMMUNITY): Payer: Managed Care, Other (non HMO)

## 2012-09-09 NOTE — ED Provider Notes (Signed)
Medical screening examination/treatment/procedure(s) were performed by non-physician practitioner and as supervising physician I was immediately available for consultation/collaboration.  Lalana Wachter T Kemyra August, MD 09/09/12 0752 

## 2012-09-10 ENCOUNTER — Encounter (HOSPITAL_BASED_OUTPATIENT_CLINIC_OR_DEPARTMENT_OTHER): Payer: Self-pay | Admitting: *Deleted

## 2012-09-10 ENCOUNTER — Emergency Department (HOSPITAL_BASED_OUTPATIENT_CLINIC_OR_DEPARTMENT_OTHER)
Admission: EM | Admit: 2012-09-10 | Discharge: 2012-09-11 | Disposition: A | Discharge status: Home / Self Care | Payer: Managed Care, Other (non HMO) | Attending: Emergency Medicine | Admitting: Emergency Medicine

## 2012-09-10 DIAGNOSIS — M25569 Pain in unspecified knee: Secondary | ICD-10-CM | POA: Insufficient documentation

## 2012-09-10 DIAGNOSIS — IMO0002 Reserved for concepts with insufficient information to code with codable children: Secondary | ICD-10-CM | POA: Insufficient documentation

## 2012-09-10 DIAGNOSIS — Z79899 Other long term (current) drug therapy: Secondary | ICD-10-CM | POA: Insufficient documentation

## 2012-09-10 DIAGNOSIS — Z7982 Long term (current) use of aspirin: Secondary | ICD-10-CM | POA: Insufficient documentation

## 2012-09-10 DIAGNOSIS — Z8673 Personal history of transient ischemic attack (TIA), and cerebral infarction without residual deficits: Secondary | ICD-10-CM | POA: Insufficient documentation

## 2012-09-10 MED ORDER — MELOXICAM 7.5 MG PO TABS: 7.5000 mg | ORAL_TABLET | Freq: Every day | ORAL | Status: DC

## 2012-09-10 NOTE — ED Provider Notes (Signed)
History     CSN: [id number]  Arrival date & time 09/10/12  2244   First MD Initiated Contact with Patient 09/10/12 2329      Chief Complaint  Patient presents with  . Knee Pain    (Consider location/radiation/quality/duration/timing/severity/associated sxs/prior treatment) Patient is a 52 y.o. male presenting with knee pain. The history is provided by the patient. No language interpreter was used.  Knee Pain Location:  Knee Injury: no   Knee location:  L knee Pain details:    Quality:  Burning   Radiates to:  Does not radiate   Onset quality:  Gradual   Timing:  Constant   Progression:  Unchanged Chronicity:  New Dislocation: no   Foreign body present:  No foreign bodies Prior injury to area:  No Relieved by:  Nothing Worsened by:  Nothing tried Ineffective treatments: percocet. Associated symptoms: no back pain, no decreased ROM, no stiffness, no swelling and no tingling   Risk factors: no concern for non-accidental trauma     Past Medical History  Diagnosis Date  . Stroke   . Sleep apnea   . Shortness of breath     Past Surgical History  Procedure Laterality Date  . Abdominal surgery      Family History  Problem Relation Age of Onset  . Diabetes Mother   . Diabetes Sister   . Hypertension Mother     History  Substance Use Topics  . Smoking status: Never Smoker   . Smokeless tobacco: Never Used  . Alcohol Use: No      Review of Systems  Musculoskeletal: Negative for back pain and stiffness.  All other systems reviewed and are negative.    Allergies  Review of patient's allergies indicates no known allergies.  Home Medications   Current Outpatient Rx  Name  Route  Sig  Dispense  Refill  . albuterol (PROVENTIL HFA;VENTOLIN HFA) 108 (90 BASE) MCG/ACT inhaler   Inhalation   Inhale 2 puffs into the lungs every 4 (four) hours as needed. For wheezing.         Marland Kitchen aspirin EC 81 MG tablet   Oral   Take 81 mg by mouth daily.         .  famotidine (PEPCID) 20 MG tablet      One at bedtime   30 tablet   2   . mometasone-formoterol (DULERA) 100-5 MCG/ACT AERO      Take 2 puffs first thing in am and then another 2 puffs about 12 hours later.   1 Inhaler   11   . oxyCODONE-acetaminophen (PERCOCET/ROXICET) 5-325 MG per tablet   Oral   Take 2 tablets by mouth every 4 (four) hours as needed for pain.   15 tablet   0   . pantoprazole (PROTONIX) 40 MG tablet   Oral   Take 1 tablet (40 mg total) by mouth daily. Take 30-60 min before first meal of the day   30 tablet   2   . predniSONE (STERAPRED UNI-PAK) 10 MG tablet   Oral   Take 10 mg by mouth 2 (two) times daily. 1 each am x2days and stop           BP 150/69  Pulse 80  Temp(Src) 98.3 F (36.8 C) (Oral)  Resp 20  Wt 228 lb (103.42 kg)  BMI 30.92 kg/m2  SpO2 99%  Physical Exam  Constitutional: He is oriented to person, place, and time. He appears well-developed and well-nourished. No distress.  HENT:  Head: Normocephalic and atraumatic.  Eyes: Conjunctivae and EOM are normal.  Neck: Normal range of motion. Neck supple.  Cardiovascular: Normal rate, regular rhythm and intact distal pulses.   Pulmonary/Chest: Effort normal and breath sounds normal. He has no wheezes.  Abdominal: Soft. There is no tenderness.  Musculoskeletal: Normal range of motion. He exhibits no edema and no tenderness.  Negative anterior and posterior drawer test of the left knee.  No laxity of the knee to varus or valgus stress.  No patella alta or [place]  Neurological: He is alert and oriented to person, place, and time. He has normal reflexes.  Skin: Skin is warm and dry.  Psychiatric: He has a normal mood and affect.    ED Course  Procedures (including critical care time)  Labs Reviewed - No data to display No results found.   No diagnosis found.    MDM  Negative XR and doppler this week.  Will put in knee sleeve add NSAID and refer to orthopedics        Lainee Lehrman K  Venida Tsukamoto-Rasch, MD 09/10/12 2345

## 2012-09-10 NOTE — ED Notes (Signed)
Pain in his left knee x 3 days. No known injury. Was seen for same at [facility] and given Percocet.

## 2012-09-10 NOTE — ED Notes (Signed)
MD at bedside. 

## 2012-10-15 ENCOUNTER — Ambulatory Visit: Payer: Managed Care, Other (non HMO) | Admitting: Internal Medicine

## 2013-09-10 ENCOUNTER — Emergency Department (HOSPITAL_BASED_OUTPATIENT_CLINIC_OR_DEPARTMENT_OTHER): Payer: Managed Care, Other (non HMO)

## 2013-09-10 ENCOUNTER — Emergency Department (HOSPITAL_BASED_OUTPATIENT_CLINIC_OR_DEPARTMENT_OTHER)
Admission: EM | Admit: 2013-09-10 | Discharge: 2013-09-11 | Disposition: A | Discharge status: Home / Self Care | Payer: Managed Care, Other (non HMO) | Attending: Emergency Medicine | Admitting: Emergency Medicine

## 2013-09-10 ENCOUNTER — Encounter (HOSPITAL_BASED_OUTPATIENT_CLINIC_OR_DEPARTMENT_OTHER): Payer: Self-pay | Admitting: Emergency Medicine

## 2013-09-10 DIAGNOSIS — S6991XA Unspecified injury of right wrist, hand and finger(s), initial encounter: Secondary | ICD-10-CM

## 2013-09-10 DIAGNOSIS — Z8669 Personal history of other diseases of the nervous system and sense organs: Secondary | ICD-10-CM | POA: Insufficient documentation

## 2013-09-10 DIAGNOSIS — S6990XA Unspecified injury of unspecified wrist, hand and finger(s), initial encounter: Principal | ICD-10-CM | POA: Insufficient documentation

## 2013-09-10 DIAGNOSIS — Z7982 Long term (current) use of aspirin: Secondary | ICD-10-CM | POA: Insufficient documentation

## 2013-09-10 DIAGNOSIS — IMO0002 Reserved for concepts with insufficient information to code with codable children: Secondary | ICD-10-CM | POA: Insufficient documentation

## 2013-09-10 DIAGNOSIS — S59909A Unspecified injury of unspecified elbow, initial encounter: Secondary | ICD-10-CM | POA: Insufficient documentation

## 2013-09-10 DIAGNOSIS — Z79899 Other long term (current) drug therapy: Secondary | ICD-10-CM | POA: Insufficient documentation

## 2013-09-10 DIAGNOSIS — Y9389 Activity, other specified: Secondary | ICD-10-CM | POA: Insufficient documentation

## 2013-09-10 DIAGNOSIS — Z8673 Personal history of transient ischemic attack (TIA), and cerebral infarction without residual deficits: Secondary | ICD-10-CM | POA: Insufficient documentation

## 2013-09-10 DIAGNOSIS — R296 Repeated falls: Secondary | ICD-10-CM | POA: Insufficient documentation

## 2013-09-10 DIAGNOSIS — S59919A Unspecified injury of unspecified forearm, initial encounter: Principal | ICD-10-CM

## 2013-09-10 DIAGNOSIS — Y929 Unspecified place or not applicable: Secondary | ICD-10-CM | POA: Insufficient documentation

## 2013-09-10 MED ORDER — OXYCODONE-ACETAMINOPHEN 5-325 MG PO TABS: 1.0000 | ORAL_TABLET | ORAL | Status: DC | PRN

## 2013-09-10 MED ORDER — NAPROXEN 250 MG PO TABS
500.0000 mg | ORAL_TABLET | Freq: Once | ORAL | Status: AC
  Administered 2013-09-11: 500 mg via ORAL
  Filled 2013-09-10: qty 2

## 2013-09-10 MED ORDER — MELOXICAM 7.5 MG PO TABS: 7.5000 mg | ORAL_TABLET | Freq: Every day | ORAL | Status: DC

## 2013-09-10 NOTE — ED Notes (Signed)
[name] getting out of the shower.  Denies injury other than right hand.  C/o pain, swelling over fourth/fifth metacarpals.

## 2013-09-10 NOTE — ED Provider Notes (Signed)
CSN: [id number]     Arrival date & time 09/10/13  2114 History  This chart was scribed for Rylen Hou Smitty Cords, MD by Danella Maiers, ED Scribe. This patient was seen in room MH12/MH12 and the patient's care was started at 11:37 PM.    Chief Complaint  Patient presents with  . Hand Injury   Patient is a 53 y.o. male presenting with wrist pain. The history is provided by the patient. No language interpreter was used.  Wrist Pain This is a new problem. The current episode started 1 to 2 hours ago. The problem occurs constantly. The problem has not changed since onset.Pertinent negatives include no abdominal pain. Nothing aggravates the symptoms. Nothing relieves the symptoms. He has tried nothing for the symptoms. The treatment provided no relief.   HPI Comments: [name] is a 53 y.o. male who presents to the Emergency Department complaining of constant, unchanged pain and gradually-worsening swelling over the dorsal right wrist falling while getting out of the shower tonight. He denies hitting his head or LOC.   Past Medical History  Diagnosis Date  . Stroke   . Sleep apnea   . Shortness of breath    Past Surgical History  Procedure Laterality Date  . Abdominal surgery     Family History  Problem Relation Age of Onset  . Diabetes Mother   . Diabetes Sister   . Hypertension Mother    History  Substance Use Topics  . Smoking status: Never Smoker   . Smokeless tobacco: Never Used  . Alcohol Use: No    Review of Systems  Gastrointestinal: Negative for abdominal pain.  All other systems reviewed and are negative.      Allergies  Review of patient's allergies indicates no known allergies.  Home Medications   Current Outpatient Rx  Name  Route  Sig  Dispense  Refill  . albuterol (PROVENTIL HFA;VENTOLIN HFA) 108 (90 BASE) MCG/ACT inhaler   Inhalation   Inhale 2 puffs into the lungs every 4 (four) hours as needed. For wheezing.         Marland Kitchen aspirin EC 81 MG  tablet   Oral   Take 81 mg by mouth daily.         . famotidine (PEPCID) 20 MG tablet      One at bedtime   30 tablet   2   . meloxicam (MOBIC) 7.5 MG tablet   Oral   Take 1 tablet (7.5 mg total) by mouth daily.   7 tablet   0   . mometasone-formoterol (DULERA) 100-5 MCG/ACT AERO      Take 2 puffs first thing in am and then another 2 puffs about 12 hours later.   1 Inhaler   11   . oxyCODONE-acetaminophen (PERCOCET/ROXICET) 5-325 MG per tablet   Oral   Take 2 tablets by mouth every 4 (four) hours as needed for pain.   15 tablet   0   . pantoprazole (PROTONIX) 40 MG tablet   Oral   Take 1 tablet (40 mg total) by mouth daily. Take 30-60 min before first meal of the day   30 tablet   2   . predniSONE (STERAPRED UNI-PAK) 10 MG tablet   Oral   Take 10 mg by mouth 2 (two) times daily. 1 each am x2days and stop          BP 141/81  Pulse 73  Temp(Src) 98.3 F (36.8 C) (Oral)  Resp 16  Ht 6' (  1.829 m)  Wt 222 lb (100.699 kg)  BMI 30.10 kg/m2  SpO2 98% Physical Exam  Nursing note and vitals reviewed. Constitutional: He is oriented to person, place, and time. He appears well-developed and well-nourished.  HENT:  Head: Normocephalic and atraumatic.  Eyes: EOM are normal. Pupils are equal, round, and reactive to light.  Neck: Normal range of motion. Neck supple.  Cardiovascular: Normal rate, regular rhythm, normal heart sounds and intact distal pulses.   Pulmonary/Chest: Effort normal and breath sounds normal. He has no wheezes.  Abdominal: Bowel sounds are normal. He exhibits no distension. There is no tenderness.  Musculoskeletal: Normal range of motion. He exhibits no edema and no tenderness.  Right wrist - no snuff box tenderness, intact radial pulse, cap refill less than 2 sec to all digits of the right hand right hand neurovascularly intact  Neurological: He is alert and oriented to person, place, and time. He has normal strength and normal reflexes. No  cranial nerve deficit or sensory deficit.  Skin: Skin is warm and dry. No rash noted.  Psychiatric: He has a normal mood and affect.    ED Course  Procedures (including critical care time) Medications - No data to display  DIAGNOSTIC STUDIES: Oxygen Saturation is 98% on RA, normal by my interpretation.    COORDINATION OF CARE: 11:39 PM- Discussed treatment plan with pt. Pt agrees to plan.    Labs Review Labs Reviewed - No data to display Imaging Review Dg Wrist Complete Right  09/10/2013   CLINICAL DATA:  53 year old male status post fall with blunt trauma and pain radiating to the wrist. Suspicious appearance of the base of the third metacarpal on earlier hand films. Initial encounter.  EXAM: RIGHT WRIST - COMPLETE 3+ VIEW  COMPARISON:  Right hand radiographs from the same day reported separately.  FINDINGS: On these images, the cortical irregularity at the radial base of the third metacarpal appears corticated, chronic. No displaced fracture fragment or definite acute fracture lucency. Other proximal metacarpals also appear intact. Scaphoid intact. Carpal bone alignment within normal limits. Distal radius and ulna intact.  IMPRESSION: These images suggest the cortical irregularity at the base of the third metacarpal is chronic, healed. No definite acute fracture identified.  Follow-up films are recommended if symptoms persist.   Electronically Signed   By: Augusto GambleLee  Hall M.D.   On: 09/10/2013 22:55   Dg Hand Complete Right  09/10/2013   CLINICAL DATA:  53 year old male status post fall with pain and swelling. Initial encounter.  EXAM: RIGHT HAND - COMPLETE 3+ VIEW  COMPARISON:  None.  FINDINGS: Bone mineralization is within normal limits. Distal radius and ulna intact. Carpal bone alignment preserved.  Cortical irregularity at the base of the third metacarpal (arrow). Other proximal metacarpals appear intact. There is dorsal soft tissue swelling at this level. Distal metacarpals and MCP joints  intact, perhaps healed distal fifth metacarpal fracture. Phalanges intact. Chronic dorsal osteophytosis at the fifth distal phalanx.  IMPRESSION: 1. Irregularity at the base of the third metacarpal with dorsal soft tissue swelling suspicious for minimally displaced acute fracture. Recommend followup dedicated right wrist series. 2. No other acute fracture or dislocation identified.   Electronically Signed   By: Augusto GambleLee  Hall M.D.   On: 09/10/2013 22:06    EKG Interpretation   None       MDM   Final diagnoses:  None    Will treat as a fracture and splint and refer to hand surgery for follow up.  Patient states he knows he has an old injury to the wrist.    Pain medication RX given  I personally performed the services described in this documentation, which was scribed in my presence. The recorded information has been reviewed and is accurate.    Jasmine Awe, MD 09/11/13 [phone]

## 2013-09-16 ENCOUNTER — Encounter (HOSPITAL_BASED_OUTPATIENT_CLINIC_OR_DEPARTMENT_OTHER): Payer: Self-pay | Admitting: Emergency Medicine

## 2013-09-16 ENCOUNTER — Emergency Department (HOSPITAL_BASED_OUTPATIENT_CLINIC_OR_DEPARTMENT_OTHER)
Admission: EM | Admit: 2013-09-16 | Discharge: 2013-09-16 | Disposition: A | Discharge status: Home / Self Care | Payer: Managed Care, Other (non HMO) | Attending: Emergency Medicine | Admitting: Emergency Medicine

## 2013-09-16 ENCOUNTER — Emergency Department (HOSPITAL_BASED_OUTPATIENT_CLINIC_OR_DEPARTMENT_OTHER): Payer: Managed Care, Other (non HMO)

## 2013-09-16 DIAGNOSIS — Z4789 Encounter for other orthopedic aftercare: Secondary | ICD-10-CM

## 2013-09-16 DIAGNOSIS — Z7982 Long term (current) use of aspirin: Secondary | ICD-10-CM | POA: Insufficient documentation

## 2013-09-16 DIAGNOSIS — IMO0002 Reserved for concepts with insufficient information to code with codable children: Secondary | ICD-10-CM | POA: Insufficient documentation

## 2013-09-16 DIAGNOSIS — Z79899 Other long term (current) drug therapy: Secondary | ICD-10-CM | POA: Insufficient documentation

## 2013-09-16 DIAGNOSIS — Z4689 Encounter for fitting and adjustment of other specified devices: Secondary | ICD-10-CM | POA: Insufficient documentation

## 2013-09-16 DIAGNOSIS — Z8673 Personal history of transient ischemic attack (TIA), and cerebral infarction without residual deficits: Secondary | ICD-10-CM | POA: Insufficient documentation

## 2013-09-16 NOTE — ED Provider Notes (Addendum)
CSN: [id number]     Arrival date & time 09/16/13  1430 History   First MD Initiated Contact with Patient 09/16/13 1440     Chief Complaint  Patient presents with  . Cast Problem     (Consider location/radiation/quality/duration/timing/severity/associated sxs/prior Treatment) HPI Comments: Pt seen appx 5 days ago and placed in a volar wrist splint due to possible frx and he has f/u with ortho on tues but his splint is starting to fall off and return for new splint placement.  Denies worsening, pain swelling or numbness.  The history is provided by the patient.    Past Medical History  Diagnosis Date  . Stroke   . Sleep apnea   . Shortness of breath    Past Surgical History  Procedure Laterality Date  . Abdominal surgery     Family History  Problem Relation Age of Onset  . Diabetes Mother   . Diabetes Sister   . Hypertension Mother    History  Substance Use Topics  . Smoking status: Never Smoker   . Smokeless tobacco: Never Used  . Alcohol Use: No    Review of Systems  All other systems reviewed and are negative.      Allergies  Review of patient's allergies indicates no known allergies.  Home Medications   Current Outpatient Rx  Name  Route  Sig  Dispense  Refill  . albuterol (PROVENTIL HFA;VENTOLIN HFA) 108 (90 BASE) MCG/ACT inhaler   Inhalation   Inhale 2 puffs into the lungs every 4 (four) hours as needed. For wheezing.         Marland Kitchen. aspirin EC 81 MG tablet   Oral   Take 81 mg by mouth daily.         . famotidine (PEPCID) 20 MG tablet      One at bedtime   30 tablet   2   . meloxicam (MOBIC) 7.5 MG tablet   Oral   Take 1 tablet (7.5 mg total) by mouth daily.   7 tablet   0   . meloxicam (MOBIC) 7.5 MG tablet   Oral   Take 1 tablet (7.5 mg total) by mouth daily.   7 tablet   0   . mometasone-formoterol (DULERA) 100-5 MCG/ACT AERO      Take 2 puffs first thing in am and then another 2 puffs about 12 hours later.   1 Inhaler   11   .  oxyCODONE-acetaminophen (PERCOCET/ROXICET) 5-325 MG per tablet   Oral   Take 2 tablets by mouth every 4 (four) hours as needed for pain.   15 tablet   0   . oxyCODONE-acetaminophen (PERCOCET/ROXICET) 5-325 MG per tablet   Oral   Take 1 tablet by mouth every 4 (four) hours as needed for moderate pain or severe pain.   13 tablet   0   . pantoprazole (PROTONIX) 40 MG tablet   Oral   Take 1 tablet (40 mg total) by mouth daily. Take 30-60 min before first meal of the day   30 tablet   2   . predniSONE (STERAPRED UNI-PAK) 10 MG tablet   Oral   Take 10 mg by mouth 2 (two) times daily. 1 each am x2days and stop          BP 150/80  Pulse 65  Temp(Src) 97.9 F (36.6 C) (Oral)  Resp 18  Ht 6' (1.829 m)  Wt 222 lb (100.699 kg)  BMI 30.10 kg/m2  SpO2 98% Physical Exam  Nursing note and vitals reviewed. Constitutional: He is oriented to person, place, and time. He appears well-developed and well-nourished. No distress.  HENT:  Head: Normocephalic and atraumatic.  Eyes: EOM are normal. Pupils are equal, round, and reactive to light.  Musculoskeletal:  Normal sensation and strength of fingers.  <2sec cap refill.  No swelling of fingers noted.  Neurological: He is alert and oriented to person, place, and time.  Skin: Skin is warm and dry.  Psychiatric: He has a normal mood and affect. His behavior is normal.    ED Course  Procedures (including critical care time) Labs Review Labs Reviewed - No data to display Imaging Review No results found.  EKG Interpretation   None       MDM   Final diagnoses:  Loose cast    Patient presents today for cast placement. Is placed in a splint approximately 4 days ago for possible wrist fracture and has appointment to see him surgery on Tuesday however that his splint is starting to fall apart. Otherwise neurovascularly intact and no other complaints. New splint placed and patient discharged home    Gwyneth Sprout, MD 09/16/13  1445  Gwyneth Sprout, MD 09/16/13 1447

## 2013-09-16 NOTE — ED Notes (Signed)
Pt presents today for re-wrap of his R lower arm splint.

## 2015-02-15 ENCOUNTER — Encounter: Payer: Self-pay | Admitting: *Deleted

## 2015-03-20 ENCOUNTER — Encounter: Payer: Self-pay | Admitting: Cardiology

## 2015-03-29 ENCOUNTER — Encounter: Payer: Self-pay | Admitting: Cardiology

## 2015-05-10 ENCOUNTER — Encounter (HOSPITAL_BASED_OUTPATIENT_CLINIC_OR_DEPARTMENT_OTHER): Payer: Self-pay

## 2015-05-10 ENCOUNTER — Emergency Department (HOSPITAL_BASED_OUTPATIENT_CLINIC_OR_DEPARTMENT_OTHER)
Admission: EM | Admit: 2015-05-10 | Discharge: 2015-05-10 | Disposition: A | Discharge status: Home / Self Care | Payer: Managed Care, Other (non HMO) | Attending: Emergency Medicine | Admitting: Emergency Medicine

## 2015-05-10 DIAGNOSIS — Z8669 Personal history of other diseases of the nervous system and sense organs: Secondary | ICD-10-CM | POA: Diagnosis not present

## 2015-05-10 DIAGNOSIS — Z7982 Long term (current) use of aspirin: Secondary | ICD-10-CM | POA: Diagnosis not present

## 2015-05-10 DIAGNOSIS — M79671 Pain in right foot: Secondary | ICD-10-CM | POA: Diagnosis present

## 2015-05-10 DIAGNOSIS — Z8673 Personal history of transient ischemic attack (TIA), and cerebral infarction without residual deficits: Secondary | ICD-10-CM | POA: Diagnosis not present

## 2015-05-10 DIAGNOSIS — Z7951 Long term (current) use of inhaled steroids: Secondary | ICD-10-CM | POA: Insufficient documentation

## 2015-05-10 DIAGNOSIS — M109 Gout, unspecified: Secondary | ICD-10-CM | POA: Diagnosis not present

## 2015-05-10 DIAGNOSIS — I10 Essential (primary) hypertension: Secondary | ICD-10-CM | POA: Insufficient documentation

## 2015-05-10 DIAGNOSIS — Z79899 Other long term (current) drug therapy: Secondary | ICD-10-CM | POA: Diagnosis not present

## 2015-05-10 MED ORDER — OXYCODONE-ACETAMINOPHEN 5-325 MG PO TABS: 1.0000 | ORAL_TABLET | Freq: Four times a day (QID) | ORAL | Status: DC | PRN

## 2015-05-10 MED ORDER — KETOROLAC TROMETHAMINE 60 MG/2ML IM SOLN
30.0000 mg | Freq: Once | INTRAMUSCULAR | Status: AC
  Administered 2015-05-10: 30 mg via INTRAMUSCULAR
  Filled 2015-05-10: qty 2

## 2015-05-10 MED ORDER — METHYLPREDNISOLONE 4 MG PO TBPK: ORAL_TABLET | ORAL | Status: DC

## 2015-05-10 NOTE — ED Provider Notes (Signed)
CSN: [id number]     Arrival date & time 05/10/15  0204 History   First MD Initiated Contact with Patient 05/10/15 0222     Chief Complaint  Patient presents with  . Foot Pain     (Consider location/radiation/quality/duration/timing/severity/associated sxs/prior Treatment) HPI  This is a 54 year old male with pain in the right first M.D. P joint since yesterday morning. He denies injury. The pain is worsened and is now a 10 out of 10. Pain is worse with palpation or movement. There is some associated swelling and mild erythema. He has no history of gout.  Past Medical History  Diagnosis Date  . Stroke (HCC)   . Sleep apnea   . Shortness of breath   . HTN (hypertension)    Past Surgical History  Procedure Laterality Date  . Abdominal surgery     Family History  Problem Relation Age of Onset  . Diabetes Mother   . Hypertension Mother   . Diabetes Sister    Social History  Substance Use Topics  . Smoking status: Never Smoker   . Smokeless tobacco: Never Used  . Alcohol Use: No    Review of Systems  All other systems reviewed and are negative.   Allergies  Review of patient's allergies indicates no known allergies.  Home Medications   Prior to Admission medications   Medication Sig Start Date End Date Taking? Authorizing Provider  aspirin EC 81 MG tablet Take 81 mg by mouth daily.   Yes Historical Provider, MD  albuterol (PROVENTIL HFA;VENTOLIN HFA) 108 (90 BASE) MCG/ACT inhaler Inhale 2 puffs into the lungs every 4 (four) hours as needed. For wheezing.    Historical Provider, MD  famotidine (PEPCID) 20 MG tablet One at bedtime 09/03/12   Nyoka Cowden, MD  meloxicam (MOBIC) 7.5 MG tablet Take 1 tablet (7.5 mg total) by mouth daily. 09/10/12   April Palumbo, MD  meloxicam (MOBIC) 7.5 MG tablet Take 1 tablet (7.5 mg total) by mouth daily. 09/10/13   April Palumbo, MD  mometasone-formoterol [facility]) 100-5 MCG/ACT AERO Take 2 puffs first thing in am and then another 2 puffs  about 12 hours later. 09/03/12   Nyoka Cowden, MD  oxyCODONE-acetaminophen (PERCOCET/ROXICET) 5-325 MG per tablet Take 2 tablets by mouth every 4 (four) hours as needed for pain. 09/08/12   Emilia Beck, PA-C  oxyCODONE-acetaminophen (PERCOCET/ROXICET) 5-325 MG per tablet Take 1 tablet by mouth every 4 (four) hours as needed for moderate pain or severe pain. 09/10/13   April Palumbo, MD  pantoprazole (PROTONIX) 40 MG tablet Take 1 tablet (40 mg total) by mouth daily. Take 30-60 min before first meal of the day 09/03/12   Nyoka Cowden, MD  predniSONE (STERAPRED UNI-PAK) 10 MG tablet Take 10 mg by mouth 2 (two) times daily. 1 each am x2days and stop 09/03/12   Nyoka Cowden, MD   BP 155/80 mmHg  Pulse 66  Temp(Src) 98.3 F (36.8 C) (Oral)  Resp 18  Ht 6' (1.829 m)  Wt 230 lb (104.327 kg)  BMI 31.19 kg/m2  SpO2 98%   Physical Exam  General: Well-developed, well-nourished male in no acute distress; appearance consistent with age of record HENT: normocephalic; atraumatic Eyes: pupils equal, round and reactive to light; extraocular muscles intact Neck: supple Heart: regular rate and rhythm Lungs: clear to auscultation bilaterally Abdomen: soft; nondistended; nontender; bowel sounds present Extremities: No deformity; full range of motion except right great toe; pulses normal; tenderness of right first MTP joint  with mild erythema and swelling Neurologic: Awake, alert and oriented; motor function intact in all extremities and symmetric; no facial droop Skin: Warm and dry Psychiatric: Normal mood and affect    ED Course  Procedures (including critical care time)   MDM  Examination and history consistent with gout.     Paula LibraJohn Madelyn Tlatelpa, MD 05/10/15 [phone]

## 2015-05-10 NOTE — ED Notes (Signed)
Pt c/o rt foot pain since yesterday morning, denies injury, pain has worsen over time.

## 2015-05-10 NOTE — Discharge Instructions (Signed)

## 2015-05-11 DIAGNOSIS — I10 Essential (primary) hypertension: Secondary | ICD-10-CM | POA: Insufficient documentation

## 2015-07-02 ENCOUNTER — Ambulatory Visit (INDEPENDENT_AMBULATORY_CARE_PROVIDER_SITE_OTHER): Payer: Managed Care, Other (non HMO) | Admitting: Family Medicine

## 2015-07-02 ENCOUNTER — Ambulatory Visit (INDEPENDENT_AMBULATORY_CARE_PROVIDER_SITE_OTHER): Payer: Managed Care, Other (non HMO)

## 2015-07-02 VITALS — BP 128/80 | HR 79 | Temp 98.3°F | Resp 17 | Ht 71.0 in | Wt 233.0 lb

## 2015-07-02 DIAGNOSIS — M79671 Pain in right foot: Secondary | ICD-10-CM

## 2015-07-02 DIAGNOSIS — Z8673 Personal history of transient ischemic attack (TIA), and cerebral infarction without residual deficits: Secondary | ICD-10-CM | POA: Insufficient documentation

## 2015-07-02 DIAGNOSIS — Q249 Congenital malformation of heart, unspecified: Secondary | ICD-10-CM | POA: Insufficient documentation

## 2015-07-02 MED ORDER — INDOMETHACIN 50 MG PO CAPS: 50.0000 mg | ORAL_CAPSULE | Freq: Three times a day (TID) | ORAL | Status: DC

## 2015-07-02 NOTE — Progress Notes (Signed)
Subjective:   Patient ID: [name], male     DOB: 05-19-1961, 54 y.o.    MRN: [id number]  PCP: Devra Dopp, MD  Chief Complaint  Patient presents with  . Foot Pain    knot on foot per patient pain     HPI  Presents for evaluation of RIGHT foot pain.  "Feels a knot on the inside." "Very very very painful." "Can't even rub up against a sheet, it hurts too much." Points to a swelling at the 1st MTP to locate the pain. Began 06/29/2015. Began while he was at work. Wears steel-toed shoes at work.  Saw his PCP last month and was diagnosed with gout at the same site. That resolved with the medication she prescribed. This feels different from that. He thought it would improve over the weekend, but it hasn't.  Prior to Admission medications   Medication Sig Start Date End Date Taking? Authorizing Provider  aspirin 81 MG tablet Take 81 mg by mouth daily.   Yes Historical Provider, MD  albuterol (PROVENTIL HFA) 108 (90 BASE) MCG/ACT inhaler Inhale 2 puffs into the lungs daily as needed.  10/20/13   Historical Provider, MD     No Known Allergies   Patient Active Problem List   Diagnosis Date Noted  . H/O transient cerebral ischemia 07/02/2015  . 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 Benign essential HTN 05/11/2015  . Asthma 09/03/2012  . Stroke [facility])      Family History  Problem Relation Age of Onset  . Diabetes Mother   . Hypertension Mother   . Heart attack Mother 80  . Diabetes Sister   . Alcohol abuse Brother      Social History   Social History  . Marital Status: Single    Spouse Name: n/a  . Number of Children: 3  . Years of Education: associates   Occupational History  . production operator [employer]   Social History Main Topics  . Smoking status: Never Smoker   . Smokeless tobacco: Never Used  . Alcohol Use: No  . Drug Use: No  . Sexual Activity: Not on file   Other Topics Concern  . Not on file   Social History  Narrative   His father lives with him.   Adult children live independently, 2 in [place], one in [place]        Review of Systems  Constitutional: Negative for fever, chills and fatigue.  Musculoskeletal: Positive for arthralgias and gait problem.  Skin: Positive for rash (chronic tinea on the feet, improving with treatment).         Objective:  Physical Exam  Constitutional: He is oriented to person, place, and time. He appears well-developed and well-nourished. He is active and cooperative. No distress.  BP 128/80 mmHg  Pulse 79  Temp(Src) 98.3 F (36.8 C) (Oral)  Resp 17  Ht  (1.803 m)  Wt 233 lb (105.688 kg)  BMI 32.51 kg/m2  SpO2 97%   Eyes: Conjunctivae are normal.  Pulmonary/Chest: Effort normal.  Musculoskeletal:       Right ankle: Normal. Achilles tendon normal.       Right foot: There is decreased range of motion, tenderness, bony tenderness and swelling. There is normal capillary refill, no crepitus, no deformity and no laceration.       Feet:  Neurological: He is alert and oriented to person, place, and time.  Psychiatric: He has a normal mood and affect. His speech is normal  and behavior is normal.    RIGHT foot: [facility] reading (PRIMARY) by  Dr. Milus GlazierLauenstein. Irregularity of the sesamoid bone may represent a fracture.           Assessment & Plan:  1. Acute foot pain, right Given the location and history, this is most likely gout. However, he does have an irregularity of the sesamoid bone at the 1st MTP. When I explained this to the patient and advised a CAM walk and NSAID, he reported that he has an appointment with Dr. Elijah Birkom tomorrow for additional evaluation. CD of the xrays provided to the patient to take with him tomorrow. Expect over-read before then and will forward that information to Dr. [facility] - DG Foot Complete Right; Future - indomethacin (INDOCIN) 50 MG capsule; Take 1 capsule (50 mg total) by mouth 3 (three) times  daily with meals.  Dispense: 30 capsule; Refill: 0   Fernande Brashelle S. Donnice Nielsen, PA-C Physician Assistant-Certified [facility]   ADDENDUM: Over-read: FINDINGS: No suspected acute fracture. No dislocation. Lucency at the medial great toe sesamoid appears smooth and is likely developmental. No erosive change or soft tissue calcification.  IMPRESSION: No acute finding.  Fernande Brashelle S. Darcee Dekker, PA-C Certified Physician Assistant [facility]

## 2015-07-02 NOTE — Patient Instructions (Signed)
You may have a break in the sesamoid bone (an extra and unnecessary bone underneath the base of the big toe), but without trauma, it's unlikely that it is the source of your pain. Most likely, this is gout.  Gout Gout is when your joints become red, sore, and swell (inflamed). This is caused by the buildup of uric acid crystals in the joints. Uric acid is a chemical that is normally in the blood. If the level of uric acid gets too high in the blood, these crystals form in your joints and tissues. Over time, these crystals can form into masses near the joints and tissues. These masses can destroy bone and cause the bone to look misshapen (deformed). HOME CARE   Do not take aspirin for pain.  Only take medicine as told by your doctor.  Rest the joint as much as you can. When in bed, keep sheets and blankets off painful areas.  Keep the sore joints raised (elevated).  Put warm or cold packs on painful joints. Use of warm or cold packs depends on which works best for you.  Use crutches if the painful joint is in your leg.  Drink enough fluids to keep your pee (urine) clear or pale yellow. Limit alcohol, sugary drinks, and drinks with fructose in them.  Follow your diet instructions. Pay careful attention to how much protein you eat. Include fruits, vegetables, whole grains, and fat-free or low-fat milk products in your daily diet. Talk to your doctor or dietitian about the use of coffee, vitamin C, and cherries. These may help lower uric acid levels.  Keep a healthy body weight. GET HELP RIGHT AWAY IF:   You have watery poop (diarrhea), throw up (vomit), or have any side effects from medicines.  You do not feel better in 24 hours, or you are getting worse.  Your joint becomes suddenly more tender, and you have chills or a fever. MAKE SURE YOU:   Understand these instructions.  Will watch your condition.  Will get help right away if you are not doing well or get worse.   This  information is not intended to replace advice given to you by your health care provider. Make sure you discuss any questions you have with your health care provider.   Document Released: 04/22/2008 Document Revised: 08/04/2014 Document Reviewed: 02/25/2012 Elsevier Interactive Patient Education [employer]2016 Elsevier Inc.

## 2015-09-19 ENCOUNTER — Encounter (HOSPITAL_BASED_OUTPATIENT_CLINIC_OR_DEPARTMENT_OTHER): Payer: Self-pay | Admitting: Emergency Medicine

## 2015-09-19 ENCOUNTER — Emergency Department (HOSPITAL_BASED_OUTPATIENT_CLINIC_OR_DEPARTMENT_OTHER): Payer: Managed Care, Other (non HMO)

## 2015-09-19 ENCOUNTER — Emergency Department (HOSPITAL_BASED_OUTPATIENT_CLINIC_OR_DEPARTMENT_OTHER)
Admission: EM | Admit: 2015-09-19 | Discharge: 2015-09-19 | Disposition: A | Discharge status: Home / Self Care | Payer: Managed Care, Other (non HMO) | Attending: Emergency Medicine | Admitting: Emergency Medicine

## 2015-09-19 DIAGNOSIS — M791 Myalgia: Secondary | ICD-10-CM | POA: Insufficient documentation

## 2015-09-19 DIAGNOSIS — R51 Headache: Secondary | ICD-10-CM | POA: Diagnosis not present

## 2015-09-19 DIAGNOSIS — R509 Fever, unspecified: Secondary | ICD-10-CM | POA: Diagnosis not present

## 2015-09-19 DIAGNOSIS — R42 Dizziness and giddiness: Secondary | ICD-10-CM | POA: Diagnosis not present

## 2015-09-19 DIAGNOSIS — I10 Essential (primary) hypertension: Secondary | ICD-10-CM | POA: Diagnosis not present

## 2015-09-19 DIAGNOSIS — Z7982 Long term (current) use of aspirin: Secondary | ICD-10-CM | POA: Diagnosis not present

## 2015-09-19 DIAGNOSIS — Z8673 Personal history of transient ischemic attack (TIA), and cerebral infarction without residual deficits: Secondary | ICD-10-CM | POA: Insufficient documentation

## 2015-09-19 DIAGNOSIS — R6889 Other general symptoms and signs: Secondary | ICD-10-CM

## 2015-09-19 DIAGNOSIS — Z8669 Personal history of other diseases of the nervous system and sense organs: Secondary | ICD-10-CM | POA: Diagnosis not present

## 2015-09-19 DIAGNOSIS — R05 Cough: Secondary | ICD-10-CM | POA: Diagnosis present

## 2015-09-19 DIAGNOSIS — Z79899 Other long term (current) drug therapy: Secondary | ICD-10-CM | POA: Diagnosis not present

## 2015-09-19 DIAGNOSIS — Z8709 Personal history of other diseases of the respiratory system: Secondary | ICD-10-CM | POA: Diagnosis not present

## 2015-09-19 DIAGNOSIS — R059 Cough, unspecified: Secondary | ICD-10-CM

## 2015-09-19 MED ORDER — BENZONATATE 100 MG PO CAPS: 100.0000 mg | ORAL_CAPSULE | Freq: Three times a day (TID) | ORAL | Status: DC

## 2015-09-19 MED ORDER — IBUPROFEN 400 MG PO TABS
600.0000 mg | ORAL_TABLET | Freq: Once | ORAL | Status: AC
  Administered 2015-09-19: 600 mg via ORAL
  Filled 2015-09-19: qty 1

## 2015-09-19 MED FILL — BENZONATATE 100 MG CAPSULE: 100 | 7 days supply | Qty: 21 | Fill #0

## 2015-09-19 NOTE — ED Provider Notes (Signed)
CSN: [id number]     Arrival date & time 09/19/15  4098 History   First MD Initiated Contact with Patient 09/19/15 0935     Chief Complaint  Patient presents with  . URI     (Consider location/radiation/quality/duration/timing/severity/associated sxs/prior Treatment) HPI  55 year old male presents with a chief complaint of cough since December 2016. Is coughing every day. No sputum production. No weight loss. Patient also has developed a fever since yesterday. Unsure of how high it was. Is also developed diffuse body aches. Denies a worsening of his cough or any shortness of breath. No rhinorrhea or sore throat. He is having a headache however. Denies neck pain or stiffness. No abdominal pain or chest pain. Did not receive a flu shot.  Past Medical History  Diagnosis Date  . Sleep apnea   . Shortness of breath   . HTN (hypertension)   . Stroke [facility]) 2012    patient staes that it was a TIA, not a stroke  . Bronchitis    Past Surgical History  Procedure Laterality Date  . Abdominal surgery  1984    puncture wound; accident (chopping wood)   Family History  Problem Relation Age of Onset  . Diabetes Mother   . Hypertension Mother   . Heart attack Mother 63  . Diabetes Sister   . Alcohol abuse Brother    Social History  Substance Use Topics  . Smoking status: Never Smoker   . Smokeless tobacco: Never Used  . Alcohol Use: No    Review of Systems  Constitutional: Positive for fever. Negative for unexpected weight change.  HENT: Negative for rhinorrhea and sore throat.   Respiratory: Positive for cough. Negative for shortness of breath.   Gastrointestinal: Negative for vomiting.  Musculoskeletal: Positive for myalgias.  Neurological: Positive for dizziness (with standing) and headaches.  All other systems reviewed and are negative.     Allergies  Review of patient's allergies indicates no known allergies.  Home Medications   Prior to Admission medications    Medication Sig Start Date End Date Taking? Authorizing Provider  albuterol (PROVENTIL HFA) 108 (90 BASE) MCG/ACT inhaler Inhale 2 puffs into the lungs daily as needed.  10/20/13   Historical Provider, MD  aspirin 81 MG tablet Take 81 mg by mouth daily.    Historical Provider, MD   BP 145/61 mmHg  Pulse 79  Temp(Src) 101.5 F (38.6 C) (Oral)  Resp 18  Ht 6' (1.829 m)  Wt 235 lb (106.595 kg)  BMI 31.86 kg/m2  SpO2 97% Physical Exam  Constitutional: He is oriented to person, place, and time. He appears well-developed and well-nourished.  HENT:  Head: Normocephalic and atraumatic.  Right Ear: External ear normal.  Left Ear: External ear normal.  Nose: Nose normal.  Mouth/Throat: Oropharynx is clear and moist. No oropharyngeal exudate.  Eyes: Right eye exhibits no discharge. Left eye exhibits no discharge.  Neck: Normal range of motion. Neck supple.  Passive ROM without pain  Cardiovascular: Normal rate, regular rhythm, normal heart sounds and intact distal pulses.   Pulmonary/Chest: Effort normal and breath sounds normal. He has no wheezes. He has no rales.  Abdominal: Soft. There is no tenderness.  Musculoskeletal: He exhibits no edema.  Neurological: He is alert and oriented to person, place, and time.  Skin: Skin is warm and dry.  Nursing note and vitals reviewed.   ED Course  Procedures (including critical care time) Labs Review Labs Reviewed - No data to display  Imaging Review  Dg Chest 2 View  09/19/2015  CLINICAL DATA:  Cough, 2 months duration.  Fever over the last week. EXAM: CHEST  2 VIEW COMPARISON:  03/26/2012 FINDINGS: Heart size is normal. There is unfolding of the aorta. The lungs are clear. The vascularity is normal. No effusions. No acute bone finding. IMPRESSION: No active cardiopulmonary disease. Electronically Signed   By: Paulina Fusi M.D.   On: 09/19/2015 10:11   I have personally reviewed and evaluated these images and lab results as part of my medical  decision-making.   EKG Interpretation None      MDM   Final diagnoses:  Flu-like symptoms  Cough    Patient appears to be having an acute viral illness. Could be influenza given his diffuse myalgias. Does not have any known history of lung disease or severe comorbidities that would suggest benefit from Tamiflu. Discusses with patient, he does not want a prescription of this. He has no increased work of breathing or hypoxia. No signs of sepsis besides a fever. He appears well-hydrated. X-ray shows no pneumonia and my suspicion is for a viral illness. This will treat with ibuprofen, Tylenol, and fluids with other supportive care at home. Unclear why he is having a cough for the past 2 months, will treat with Tessalon. Recommend he follow-up with his PCP.    Pricilla Loveless, MD 09/19/15 1155

## 2015-09-19 NOTE — ED Notes (Signed)
Fever, cough, generalized aches all over since yesterday.  Cough started in December.

## 2015-11-06 ENCOUNTER — Other Ambulatory Visit: Payer: Self-pay | Admitting: Nephrology

## 2015-11-06 DIAGNOSIS — N183 Chronic kidney disease, stage 3 unspecified: Secondary | ICD-10-CM

## 2015-11-19 ENCOUNTER — Ambulatory Visit
Admission: RE | Admit: 2015-11-19 | Discharge: 2015-11-19 | Disposition: A | Discharge status: Home / Self Care | Payer: Managed Care, Other (non HMO) | Source: Ambulatory Visit | Attending: Nephrology | Admitting: Nephrology

## 2015-11-19 DIAGNOSIS — N183 Chronic kidney disease, stage 3 unspecified: Secondary | ICD-10-CM

## 2015-12-02 ENCOUNTER — Encounter (HOSPITAL_COMMUNITY): Payer: Self-pay | Admitting: *Deleted

## 2015-12-02 ENCOUNTER — Emergency Department (HOSPITAL_COMMUNITY)
Admission: EM | Admit: 2015-12-02 | Discharge: 2015-12-02 | Disposition: A | Discharge status: Home / Self Care | Payer: Managed Care, Other (non HMO) | Attending: Emergency Medicine | Admitting: Emergency Medicine

## 2015-12-02 ENCOUNTER — Emergency Department (HOSPITAL_COMMUNITY): Payer: Managed Care, Other (non HMO)

## 2015-12-02 DIAGNOSIS — J329 Chronic sinusitis, unspecified: Secondary | ICD-10-CM | POA: Diagnosis not present

## 2015-12-02 DIAGNOSIS — Z8673 Personal history of transient ischemic attack (TIA), and cerebral infarction without residual deficits: Secondary | ICD-10-CM | POA: Diagnosis not present

## 2015-12-02 DIAGNOSIS — Z7982 Long term (current) use of aspirin: Secondary | ICD-10-CM | POA: Insufficient documentation

## 2015-12-02 DIAGNOSIS — I10 Essential (primary) hypertension: Secondary | ICD-10-CM | POA: Insufficient documentation

## 2015-12-02 DIAGNOSIS — J45901 Unspecified asthma with (acute) exacerbation: Secondary | ICD-10-CM | POA: Diagnosis not present

## 2015-12-02 DIAGNOSIS — Z8669 Personal history of other diseases of the nervous system and sense organs: Secondary | ICD-10-CM | POA: Diagnosis not present

## 2015-12-02 DIAGNOSIS — Z79899 Other long term (current) drug therapy: Secondary | ICD-10-CM | POA: Diagnosis not present

## 2015-12-02 DIAGNOSIS — R0602 Shortness of breath: Secondary | ICD-10-CM | POA: Diagnosis present

## 2015-12-02 LAB — BASIC METABOLIC PANEL
Anion gap: 9 (ref 5–15)
BUN: 15 mg/dL (ref 6–20)
CHLORIDE: 105 mmol/L (ref 101–111)
CO2: 27 mmol/L (ref 22–32)
Calcium: 9.4 mg/dL (ref 8.9–10.3)
Creatinine, Ser: 1.49 mg/dL — ABNORMAL HIGH (ref 0.61–1.24)
GFR, EST AFRICAN AMERICAN: 60 mL/min — AB (ref >60)
GFR, EST NON AFRICAN AMERICAN: 52 mL/min — AB (ref >60)
Glucose, Bld: 103 mg/dL — ABNORMAL HIGH (ref 65–99)
POTASSIUM: 3.6 mmol/L (ref 3.5–5.1)
SODIUM: 141 mmol/L (ref 135–145)

## 2015-12-02 LAB — CBC WITH DIFFERENTIAL/PLATELET
Basophils Absolute: 0 10*3/uL (ref 0.0–0.1)
Basophils Relative: 0 %
EOS PCT: 3 %
Eosinophils Absolute: 0.2 10*3/uL (ref 0.0–0.7)
HEMATOCRIT: 42 % (ref 39.0–52.0)
Hemoglobin: 14.6 g/dL (ref 13.0–17.0)
LYMPHS ABS: 3.1 10*3/uL (ref 0.7–4.0)
LYMPHS PCT: 43 %
MCH: 32.2 pg (ref 26.0–34.0)
MCHC: 34.8 g/dL (ref 30.0–36.0)
MCV: 92.5 fL (ref 78.0–100.0)
MONO ABS: 0.6 10*3/uL (ref 0.1–1.0)
MONOS PCT: 8 %
NEUTROS ABS: 3.3 10*3/uL (ref 1.7–7.7)
Neutrophils Relative %: 46 %
Platelets: 207 10*3/uL (ref 150–400)
RBC: 4.54 MIL/uL (ref 4.22–5.81)
RDW: 13.6 % (ref 11.5–15.5)
WBC: 7.2 10*3/uL (ref 4.0–10.5)

## 2015-12-02 MED ORDER — FLUTICASONE PROPIONATE 50 MCG/ACT NA SUSP: 2.0000 | Freq: Every day | NASAL | Status: DC

## 2015-12-02 MED ORDER — DIPHENHYDRAMINE HCL 25 MG PO CAPS
50.0000 mg | ORAL_CAPSULE | Freq: Once | ORAL | Status: AC
  Administered 2015-12-02: 50 mg via ORAL
  Filled 2015-12-02: qty 2

## 2015-12-02 MED ORDER — PSEUDOEPHEDRINE HCL 30 MG PO TABS
30.0000 mg | ORAL_TABLET | Freq: Once | ORAL | Status: AC
  Administered 2015-12-02: 30 mg via ORAL
  Filled 2015-12-02: qty 1

## 2015-12-02 MED ORDER — DEXAMETHASONE 4 MG PO TABS
10.0000 mg | ORAL_TABLET | Freq: Once | ORAL | Status: AC
  Administered 2015-12-02: 10 mg via ORAL
  Filled 2015-12-02: qty 3

## 2015-12-02 MED ORDER — CETIRIZINE HCL 10 MG PO CAPS: 10.0000 mg | ORAL_CAPSULE | Freq: Every day | ORAL | Status: DC

## 2015-12-02 NOTE — ED Provider Notes (Signed)
By signing my name below, I, Bethel BornBritney McCollum, attest that this documentation has been prepared under the direction and in the presence of Wesson Stith N Jarick Harkins, DO. Electronically Signed: Bethel BornBritney McCollum, ED Scribe. 12/02/2015. 1:15 AM.  TIME SEEN: 1:08 AM   CHIEF COMPLAINT: SOB  HPI: [name] is a 55 y.o. male with PMHX of asthma, HTN, and stroke who presents to the Emergency Department complaining of new and constant SOB with onset approximately 1 hour prior to arrival. Pt states that he feels like his face and throat are swollen and he cannot breathe through his nose. Associated symptoms include nasal congestion with no relief from Afrin. Pt denies cough, chest pain or discomfort. No history of allergies. No known sick contact or international travel. No history of asthma or COPD. No history of DVT/PE.  No history of cardiac disease. He does not smoke.    ROS: See HPI Constitutional: no fever  Eyes: no drainage  ENT: no runny nose   Cardiovascular:  no chest pain  Resp: SOB  GI: no vomiting GU: no dysuria Integumentary: no rash  Allergy: no hives  Musculoskeletal: no leg swelling  Neurological: no slurred speech ROS otherwise negative  PAST MEDICAL HISTORY/PAST SURGICAL HISTORY:  Past Medical History  Diagnosis Date  . Sleep apnea   . Shortness of breath   . HTN (hypertension)   . Stroke [facility]) 2012    patient staes that it was a TIA, not a stroke  . Bronchitis     MEDICATIONS:  Prior to Admission medications   Medication Sig Start Date End Date Taking? Authorizing Provider  albuterol (PROVENTIL HFA) 108 (90 BASE) MCG/ACT inhaler Inhale 2 puffs into the lungs daily as needed.  10/20/13   Historical Provider, MD  aspirin 81 MG tablet Take 81 mg by mouth daily.    Historical Provider, MD  benzonatate (TESSALON) 100 MG capsule Take 1 capsule (100 mg total) by mouth every 8 (eight) hours. 09/19/15   Pricilla LovelessScott Goldston, MD    ALLERGIES:  No Known Allergies  SOCIAL HISTORY:   Social History  Substance Use Topics  . Smoking status: Never Smoker   . Smokeless tobacco: Never Used  . Alcohol Use: No    FAMILY HISTORY: Family History  Problem Relation Age of Onset  . Diabetes Mother   . Hypertension Mother   . Heart attack Mother [id number]  . Diabetes Sister   . Alcohol abuse Brother     EXAM: BP 162/96 mmHg  Pulse 87  Temp(Src) 99.3 F (37.4 C) (Oral)  Resp 20  SpO2 93% CONSTITUTIONAL: Alert and oriented and responds appropriately to questions. Well-appearing; well-nourished HEAD: Normocephalic EYES: Conjunctivae clear, PERRL ENT: normal nose; no rhinorrhea; moist mucous membranes; No pharyngeal erythema or petechiae, no tonsillar hypertrophy or exudate, no uvular deviation, no trismus or drooling, normal phonation, no stridor, no dental caries or abscess noted, no Ludwig's angina, tongue sits flat in the bottom of the mouth; TMs are clear bilaterally without erythema, purulence, bulging, perforation, effusion.  No cerumen impaction or sign of foreign body in the external auditory canal. No inflammation, erythema or drainage from the external auditory canal. No signs of mastoiditis. No pain with manipulation of the pinna bilaterally.  Patient appears to have some nasal congestion with some tenderness over his bilateral maxillary sinuses without facial erythema, warmth or swelling. No angioedema. He does have a small amount of clear sinus drainage noted in the posterior oropharynx. NECK: Supple, no meningismus, no LAD  CARD: RRR;  S1 and S2 appreciated; no murmurs, no clicks, no rubs, no gallops RESP: Normal chest excursion without splinting or tachypnea; breath sounds clear and equal bilaterally; no wheezes, no rhonchi, no rales, no hypoxia or respiratory distress, speaking full sentences ABD/GI: Normal bowel sounds; non-distended; soft, non-tender, no rebound, no guarding, no peritoneal signs BACK:  The back appears normal and is non-tender to palpation, there is  no CVA tenderness EXT: Normal ROM in all joints; non-tender to palpation; no edema; normal capillary refill; no cyanosis, no calf tenderness or swelling    SKIN: Normal color for age and race; warm; no rash NEURO: Moves all extremities equally, sensation to light touch intact diffusely, cranial nerves II through XII intact PSYCH: The patient's mood and manner are appropriate. Grooming and personal hygiene are appropriate.  MEDICAL DECISION MAKING: Patient here with nasal congestion. Does have a low-grade temperature of 99.3. Suspect viral sinusitis versus seasonal allergies. This time I do not feel he needs to be on antibiotics. No sign of deep space neck infection, pneumonia, meningitis, peritonsillar abscess. No facial cellulitis, Ludwig's angina. Have recommended that he continue using Afrin as needed but that he does not use this medication for more than 3 days in a row. Have recommended using Sudafed for nasal congestion as needed. Recommended alternating Zyrtec and Benadryl. Also discharge with Flonase nasal spray. Will give dose of Decadron in the emergency department for symptomatic relief. He has no cough, chest pain. I feel his shortness of breath is 100% coming from nasal congestion. He is very well-appearing. I feel he is safe to be discharged. Discussed return precautions.  Labs, chest x-ray, EKG ordered in triage are all unremarkable.  At this time, I do not feel there is any life-threatening condition present. I have reviewed and discussed all results (EKG, imaging, lab, urine as appropriate), exam findings with patient. I have reviewed nursing notes and appropriate previous records.  I feel the patient is safe to be discharged home without further emergent workup. Discussed usual and customary return precautions. Patient and family (if present) verbalize understanding and are comfortable with this plan.  Patient will follow-up with their primary care provider. If they do not have a primary  care provider, information for follow-up has been provided to them. All questions have been answered.    EKG Interpretation  Date/Time:  Sunday Dec 02 2015 00:14:35 EDT Ventricular Rate:  86 PR Interval:  176 QRS Duration: 90 QT Interval:  384 QTC Calculation: 459 R Axis:   9 Text Interpretation:  Normal sinus rhythm Normal ECG No significant change since last tracing Confirmed by Shelbee Apgar,  DO, Tyneisha Hegeman [phone]) on 12/02/2015 12:49:29 AM        I personally performed the services described in this documentation, which was scribed in my presence. The recorded information has been reviewed and is accurate.     Layla Maw Sarha Bartelt, DO 12/02/15 [phone]

## 2015-12-02 NOTE — Discharge Instructions (Signed)

## 2015-12-02 NOTE — ED Notes (Signed)
Patient presents with c/o feeling SOB for about the last hour.  States he has been congested and no feeling well

## 2016-03-27 ENCOUNTER — Encounter: Payer: Self-pay | Admitting: Family Medicine

## 2016-07-15 ENCOUNTER — Other Ambulatory Visit: Payer: Self-pay | Admitting: Occupational Medicine

## 2016-07-15 ENCOUNTER — Ambulatory Visit: Payer: Self-pay

## 2016-07-15 DIAGNOSIS — Z0000 Encounter for general adult medical examination without abnormal findings: Secondary | ICD-10-CM

## 2016-08-19 ENCOUNTER — Encounter (HOSPITAL_BASED_OUTPATIENT_CLINIC_OR_DEPARTMENT_OTHER): Payer: Self-pay | Admitting: *Deleted

## 2016-08-20 ENCOUNTER — Encounter (HOSPITAL_BASED_OUTPATIENT_CLINIC_OR_DEPARTMENT_OTHER): Payer: Self-pay | Admitting: Anesthesiology

## 2016-08-20 NOTE — Progress Notes (Signed)
Pt instructed npo pmn 1/25 x pain med/ zyrtec w sip of water.  Ok to use flonase.  Pt to use inhaler and bring it w/him.  [facility].  Needs istat 8 on arrival.  ekg in epic.

## 2016-08-20 NOTE — Progress Notes (Signed)
Chart reviewed w Dr. Malen GauzeFoster. bmet on arrival

## 2016-08-20 NOTE — Anesthesia Preprocedure Evaluation (Addendum)
Anesthesia Evaluation  Patient identified by MRN, date of birth, ID band Patient awake    Reviewed: Allergy & Precautions, NPO status , Patient's Chart, lab work & pertinent test results  Airway Mallampati: III       Dental no notable dental hx. (+) Teeth Intact   Pulmonary asthma , sleep apnea ,    Pulmonary exam normal breath sounds clear to auscultation       Cardiovascular hypertension, Normal cardiovascular exam Rhythm:Regular Rate:Normal  Hx/o PFO   Neuro/Psych TIACVA, No Residual Symptoms negative psych ROS   GI/Hepatic negative GI ROS, Neg liver ROS,   Endo/Other    Renal/GU Renal diseaseHx/o renal cyst  negative genitourinary   Musculoskeletal  (+) Arthritis , Osteoarthritis,  OA left shoulder AC Arthrosis Impingement syndrome left shoulder Possible rotator cuff tear left shoulder   Abdominal (+) + obese,   Peds  Hematology   Anesthesia Other Findings   Reproductive/Obstetrics                             Chemistry       Anesthesia Physical Anesthesia Plan  ASA: II  Anesthesia Plan: General and Regional   Post-op Pain Management:  Regional for Post-op pain   Induction: Intravenous  Airway Management Planned: Oral ETT  Additional Equipment:   Intra-op Plan:   Post-operative Plan: Extubation in OR  Informed Consent: I have reviewed the patients History and Physical, chart, labs and discussed the procedure including the risks, benefits and alternatives for the proposed anesthesia with the patient or authorized representative who has indicated his/her understanding and acceptance.     Plan Discussed with: CRNA, Anesthesiologist and Surgeon  Anesthesia Plan Comments:        Anesthesia Quick Evaluation

## 2016-08-22 ENCOUNTER — Ambulatory Visit (HOSPITAL_BASED_OUTPATIENT_CLINIC_OR_DEPARTMENT_OTHER): Payer: Managed Care, Other (non HMO) | Admitting: Anesthesiology

## 2016-08-22 ENCOUNTER — Ambulatory Visit (HOSPITAL_BASED_OUTPATIENT_CLINIC_OR_DEPARTMENT_OTHER)
Admission: RE | Admit: 2016-08-22 | Discharge: 2016-08-22 | Disposition: A | Discharge status: Home / Self Care | Payer: Managed Care, Other (non HMO) | Source: Ambulatory Visit | Attending: Orthopedic Surgery | Admitting: Orthopedic Surgery

## 2016-08-22 ENCOUNTER — Encounter (HOSPITAL_BASED_OUTPATIENT_CLINIC_OR_DEPARTMENT_OTHER)
Admission: RE | Disposition: A | Discharge status: Home / Self Care | Payer: Self-pay | Source: Ambulatory Visit | Attending: Orthopedic Surgery

## 2016-08-22 ENCOUNTER — Encounter (HOSPITAL_BASED_OUTPATIENT_CLINIC_OR_DEPARTMENT_OTHER): Payer: Self-pay

## 2016-08-22 DIAGNOSIS — S43432A Superior glenoid labrum lesion of left shoulder, initial encounter: Secondary | ICD-10-CM | POA: Diagnosis not present

## 2016-08-22 DIAGNOSIS — J4520 Mild intermittent asthma, uncomplicated: Secondary | ICD-10-CM | POA: Diagnosis not present

## 2016-08-22 DIAGNOSIS — M7542 Impingement syndrome of left shoulder: Secondary | ICD-10-CM | POA: Diagnosis present

## 2016-08-22 DIAGNOSIS — I129 Hypertensive chronic kidney disease with stage 1 through stage 4 chronic kidney disease, or unspecified chronic kidney disease: Secondary | ICD-10-CM | POA: Diagnosis not present

## 2016-08-22 DIAGNOSIS — Z8673 Personal history of transient ischemic attack (TIA), and cerebral infarction without residual deficits: Secondary | ICD-10-CM | POA: Insufficient documentation

## 2016-08-22 DIAGNOSIS — Z7982 Long term (current) use of aspirin: Secondary | ICD-10-CM | POA: Diagnosis not present

## 2016-08-22 DIAGNOSIS — X58XXXA Exposure to other specified factors, initial encounter: Secondary | ICD-10-CM | POA: Diagnosis not present

## 2016-08-22 DIAGNOSIS — Z79899 Other long term (current) drug therapy: Secondary | ICD-10-CM | POA: Insufficient documentation

## 2016-08-22 DIAGNOSIS — M7522 Bicipital tendinitis, left shoulder: Secondary | ICD-10-CM | POA: Diagnosis present

## 2016-08-22 DIAGNOSIS — N183 Chronic kidney disease, stage 3 (moderate): Secondary | ICD-10-CM | POA: Insufficient documentation

## 2016-08-22 DIAGNOSIS — M19012 Primary osteoarthritis, left shoulder: Secondary | ICD-10-CM | POA: Diagnosis not present

## 2016-08-22 HISTORY — DX: Hyperlipidemia, unspecified: E78.5

## 2016-08-22 HISTORY — DX: Incomplete rotator cuff tear or rupture of unspecified shoulder, not specified as traumatic: M75.110

## 2016-08-22 HISTORY — DX: Other specified joint disorders, left shoulder: M25.812

## 2016-08-22 HISTORY — DX: Mild intermittent asthma, uncomplicated: J45.20

## 2016-08-22 HISTORY — DX: Chronic kidney disease, stage 3 (moderate): N18.3

## 2016-08-22 HISTORY — DX: Patent foramen ovale: Q21.12

## 2016-08-22 HISTORY — DX: Cyst of kidney, acquired: N28.1

## 2016-08-22 HISTORY — DX: Atrial septal defect: Q21.1

## 2016-08-22 HISTORY — PX: SHOULDER ARTHROSCOPY WITH ROTATOR CUFF REPAIR AND OPEN BICEPS TENODESIS: SHX6677

## 2016-08-22 HISTORY — DX: Personal history of transient ischemic attack (TIA), and cerebral infarction without residual deficits: Z86.73

## 2016-08-22 HISTORY — DX: Impingement syndrome of left shoulder: M75.42

## 2016-08-22 HISTORY — DX: Unspecified osteoarthritis, unspecified site: M19.90

## 2016-08-22 HISTORY — DX: Benign prostatic hyperplasia without lower urinary tract symptoms: N40.0

## 2016-08-22 HISTORY — DX: Chronic kidney disease, stage 3 unspecified: N18.30

## 2016-08-22 LAB — POCT I-STAT, CHEM 8
BUN: 22 mg/dL — AB (ref 6–20)
CHLORIDE: 105 mmol/L (ref 101–111)
Calcium, Ion: 1.27 mmol/L (ref 1.15–1.40)
Creatinine, Ser: 1.4 mg/dL — ABNORMAL HIGH (ref 0.61–1.24)
GLUCOSE: 101 mg/dL — AB (ref 65–99)
HCT: 43 % (ref 39.0–52.0)
Hemoglobin: 14.6 g/dL (ref 13.0–17.0)
POTASSIUM: 3.9 mmol/L (ref 3.5–5.1)
Sodium: 144 mmol/L (ref 135–145)
TCO2: 28 mmol/L (ref 0–100)

## 2016-08-22 SURGERY — SHOULDER ARTHROSCOPY WITH SUBACROMIAL DECOMPRESSION AND DISTAL CLAVICLE EXCISION
Anesthesia: Regional | Site: Shoulder | Laterality: Left

## 2016-08-22 MED ORDER — BUPIVACAINE-EPINEPHRINE (PF) 0.5% -1:200000 IJ SOLN
INTRAMUSCULAR | Status: DC | PRN
  Administered 2016-08-22: 30 mL via PERINEURAL

## 2016-08-22 MED ORDER — SUCCINYLCHOLINE CHLORIDE 200 MG/10ML IV SOSY
PREFILLED_SYRINGE | INTRAVENOUS | Status: DC | PRN
  Administered 2016-08-22: 120 mg via INTRAVENOUS

## 2016-08-22 MED ORDER — EPHEDRINE SULFATE 50 MG/ML IJ SOLN
INTRAMUSCULAR | Status: DC | PRN
  Administered 2016-08-22 (×4): 5 mg via INTRAVENOUS

## 2016-08-22 MED ORDER — LIDOCAINE HCL (CARDIAC) 20 MG/ML IV SOLN
INTRAVENOUS | Status: DC | PRN
  Administered 2016-08-22: 50 mg via INTRAVENOUS

## 2016-08-22 MED ORDER — ONDANSETRON HCL 4 MG/2ML IJ SOLN
INTRAMUSCULAR | Status: AC
  Filled 2016-08-22: qty 2

## 2016-08-22 MED ORDER — CEFAZOLIN SODIUM-DEXTROSE 2-4 GM/100ML-% IV SOLN
INTRAVENOUS | Status: AC
  Filled 2016-08-22: qty 100

## 2016-08-22 MED ORDER — SODIUM CHLORIDE 0.9 % IR SOLN
Status: DC | PRN
  Administered 2016-08-22: 12000 mL

## 2016-08-22 MED ORDER — MEPERIDINE HCL 25 MG/ML IJ SOLN
6.2500 mg | INTRAMUSCULAR | Status: DC | PRN
  Filled 2016-08-22: qty 1

## 2016-08-22 MED ORDER — LIDOCAINE 2% (20 MG/ML) 5 ML SYRINGE
INTRAMUSCULAR | Status: AC
  Filled 2016-08-22: qty 5

## 2016-08-22 MED ORDER — SUCCINYLCHOLINE CHLORIDE 200 MG/10ML IV SOSY
PREFILLED_SYRINGE | INTRAVENOUS | Status: AC
  Filled 2016-08-22: qty 10

## 2016-08-22 MED ORDER — FENTANYL CITRATE (PF) 100 MCG/2ML IJ SOLN
INTRAMUSCULAR | Status: AC
  Filled 2016-08-22: qty 2

## 2016-08-22 MED ORDER — FENTANYL CITRATE (PF) 100 MCG/2ML IJ SOLN
100.0000 ug | Freq: Once | INTRAMUSCULAR | Status: AC
  Administered 2016-08-22: 100 ug via INTRAVENOUS
  Filled 2016-08-22: qty 2

## 2016-08-22 MED ORDER — DEXAMETHASONE SODIUM PHOSPHATE 10 MG/ML IJ SOLN
INTRAMUSCULAR | Status: AC
  Filled 2016-08-22: qty 1

## 2016-08-22 MED ORDER — CEFAZOLIN SODIUM-DEXTROSE 2-4 GM/100ML-% IV SOLN
2.0000 g | INTRAVENOUS | Status: AC
  Administered 2016-08-22: 2 g via INTRAVENOUS
  Filled 2016-08-22: qty 100

## 2016-08-22 MED ORDER — DEXAMETHASONE SODIUM PHOSPHATE 4 MG/ML IJ SOLN
INTRAMUSCULAR | Status: DC | PRN
  Administered 2016-08-22: 10 mg via INTRAVENOUS

## 2016-08-22 MED ORDER — PROPOFOL 10 MG/ML IV BOLUS
INTRAVENOUS | Status: DC | PRN
  Administered 2016-08-22: 200 mg via INTRAVENOUS

## 2016-08-22 MED ORDER — OXYCODONE HCL 5 MG PO TABS: 5.0000 mg | ORAL_TABLET | ORAL | 0 refills | Status: DC | PRN

## 2016-08-22 MED ORDER — LACTATED RINGERS IV SOLN
INTRAVENOUS | Status: DC
  Administered 2016-08-22 (×2): via INTRAVENOUS
  Filled 2016-08-22: qty 1000

## 2016-08-22 MED ORDER — ONDANSETRON HCL 4 MG/2ML IJ SOLN
4.0000 mg | Freq: Once | INTRAMUSCULAR | Status: DC | PRN
  Filled 2016-08-22: qty 2

## 2016-08-22 MED ORDER — CHLORHEXIDINE GLUCONATE 4 % EX LIQD
60.0000 mL | Freq: Once | CUTANEOUS | Status: DC
  Filled 2016-08-22: qty 118

## 2016-08-22 MED ORDER — ONDANSETRON 4 MG PO TBDP: 4.0000 mg | ORAL_TABLET | Freq: Three times a day (TID) | ORAL | 0 refills | Status: DC | PRN

## 2016-08-22 MED ORDER — HYDROMORPHONE HCL 1 MG/ML IJ SOLN
0.2500 mg | INTRAMUSCULAR | Status: DC | PRN
  Filled 2016-08-22: qty 0.5

## 2016-08-22 MED ORDER — HYDROCODONE-ACETAMINOPHEN 7.5-325 MG PO TABS
1.0000 | ORAL_TABLET | Freq: Once | ORAL | Status: DC | PRN
Start: 2016-08-22 — End: 2016-08-22
  Filled 2016-08-22: qty 1

## 2016-08-22 MED ORDER — ONDANSETRON HCL 4 MG/2ML IJ SOLN
INTRAMUSCULAR | Status: DC | PRN
  Administered 2016-08-22: 4 mg via INTRAVENOUS

## 2016-08-22 MED ORDER — PROPOFOL 10 MG/ML IV BOLUS
INTRAVENOUS | Status: AC
  Filled 2016-08-22: qty 40

## 2016-08-22 MED ORDER — GLYCOPYRROLATE 0.2 MG/ML IJ SOLN
INTRAMUSCULAR | Status: DC | PRN
  Administered 2016-08-22: 0.1 mg via INTRAVENOUS

## 2016-08-22 MED ORDER — ROCURONIUM BROMIDE 50 MG/5ML IV SOSY
PREFILLED_SYRINGE | INTRAVENOUS | Status: AC
  Filled 2016-08-22: qty 5

## 2016-08-22 MED ORDER — PROPOFOL 10 MG/ML IV BOLUS
INTRAVENOUS | Status: AC
  Filled 2016-08-22: qty 20

## 2016-08-22 MED ORDER — FENTANYL CITRATE (PF) 100 MCG/2ML IJ SOLN
INTRAMUSCULAR | Status: DC | PRN
  Administered 2016-08-22 (×4): 25 ug via INTRAVENOUS

## 2016-08-22 MED ORDER — MIDAZOLAM HCL 2 MG/2ML IJ SOLN
2.0000 mg | Freq: Once | INTRAMUSCULAR | Status: AC
  Administered 2016-08-22: 2 mg via INTRAVENOUS
  Filled 2016-08-22: qty 2

## 2016-08-22 MED ORDER — ROCURONIUM BROMIDE 100 MG/10ML IV SOLN
INTRAVENOUS | Status: DC | PRN
  Administered 2016-08-22: 5 mg via INTRAVENOUS

## 2016-08-22 MED ORDER — MIDAZOLAM HCL 2 MG/2ML IJ SOLN
INTRAMUSCULAR | Status: AC
  Filled 2016-08-22: qty 2

## 2016-08-22 MED FILL — oxyCODONE HCL 5 MG TABS: 5 | 5 days supply | Qty: 60 | Fill #0

## 2016-08-22 MED FILL — ONDANSETRON ODT 4 MG TABLET: 4 | 7 days supply | Qty: 20 | Fill #0

## 2016-08-22 SURGICAL SUPPLY — 58 items
BLADE CUDA GRT WHITE 3.5 (BLADE) ×3 IMPLANT
BLADE SURG 11 STRL SS (BLADE) ×3 IMPLANT
BLADE SURG 15 STRL LF DISP TIS (BLADE) ×1 IMPLANT
BLADE SURG 15 STRL SS (BLADE) ×2
BNDG COHESIVE 4X5 TAN NS LF (GAUZE/BANDAGES/DRESSINGS) ×3 IMPLANT
BUR VERTEX HOODED 4.5 (BURR) ×3 IMPLANT
CANNULA 5.75X7 CRYSTAL CLEAR (CANNULA) IMPLANT
CANNULA TWIST IN 8.25X7CM (CANNULA) IMPLANT
CLOSURE WOUND 1/2 X4 (GAUZE/BANDAGES/DRESSINGS) ×1
COVER BACK TABLE 60X90IN (DRAPES) ×3 IMPLANT
COVER MAYO STAND STRL (DRAPES) ×3 IMPLANT
DRAPE IMP U-DRAPE 54X76 (DRAPES) ×6 IMPLANT
DRAPE LG THREE QUARTER DISP (DRAPES) ×3 IMPLANT
DRAPE ORTHO SPLIT 77X108 STRL (DRAPES) ×4
DRAPE POUCH INSTRU U-SHP 10X18 (DRAPES) ×3 IMPLANT
DRAPE STERI 35X30 U-POUCH (DRAPES) ×3 IMPLANT
DRAPE SURG 17X23 STRL (DRAPES) ×3 IMPLANT
DRAPE SURG ORHT 6 SPLT 77X108 (DRAPES) ×2 IMPLANT
DRAPE U-SHAPE 47X51 STRL (DRAPES) ×3 IMPLANT
DRSG PAD ABDOMINAL 8X10 ST (GAUZE/BANDAGES/DRESSINGS) ×3 IMPLANT
DURAPREP 26ML APPLICATOR (WOUND CARE) ×3 IMPLANT
ELECT REM PT RETURN 9FT ADLT (ELECTROSURGICAL) ×3
ELECTRODE REM PT RTRN 9FT ADLT (ELECTROSURGICAL) ×1 IMPLANT
GAUZE XEROFORM 1X8 LF (GAUZE/BANDAGES/DRESSINGS) ×3 IMPLANT
GLOVE BIO SURGEON STRL SZ7.5 (GLOVE) ×3 IMPLANT
GLOVE BIOGEL PI IND STRL 8 (GLOVE) ×1 IMPLANT
GLOVE BIOGEL PI INDICATOR 8 (GLOVE) ×2
GOWN STRL REUS W/TWL XL LVL3 (GOWN DISPOSABLE) ×6 IMPLANT
IV NS IRRIG 3000ML ARTHROMATIC (IV SOLUTION) ×12 IMPLANT
KIT BIO-TENODESIS 3X8 DISP (MISCELLANEOUS) ×2
KIT INSRT BABSR STRL DISP BTN (MISCELLANEOUS) ×1 IMPLANT
KIT ROOM TURNOVER WOR (KITS) ×3 IMPLANT
LABRAL ARM SUSPENSION SLEEVE IMPLANT
MANIFOLD NEPTUNE II (INSTRUMENTS) ×3 IMPLANT
NEEDLE HYPO 22GX1.5 SAFETY (NEEDLE) ×3 IMPLANT
NEEDLE SCORPION MULTI FIRE (NEEDLE) IMPLANT
NEEDLE SPNL 18GX3.5 QUINCKE PK (NEEDLE) ×3 IMPLANT
PACK BASIN DAY SURGERY FS (CUSTOM PROCEDURE TRAY) ×3 IMPLANT
PAD ABD 8X10 STRL (GAUZE/BANDAGES/DRESSINGS) ×6 IMPLANT
PENCIL BUTTON HOLSTER BLD 10FT (ELECTRODE) ×3 IMPLANT
PROBE BIPOLAR ATHRO 135MM 90D (MISCELLANEOUS) ×3 IMPLANT
SCREW TENODESIS BIOCOMP 7MM (Screw) ×3 IMPLANT
SET ARTHROSCOPY TUBING (MISCELLANEOUS) ×2
SET ARTHROSCOPY TUBING PVC (MISCELLANEOUS) ×1 IMPLANT
SLEEVE ARM SUSPENSION SYSTEM (MISCELLANEOUS) ×3 IMPLANT
SLING ARM IMMOBILIZER LRG (SOFTGOODS) ×3 IMPLANT
SLING S3 LATERAL DISP (MISCELLANEOUS) ×3 IMPLANT
SPONGE GAUZE 4X4 12PLY STER LF (GAUZE/BANDAGES/DRESSINGS) ×3 IMPLANT
SPONGE LAP 4X18 X RAY DECT (DISPOSABLE) ×3 IMPLANT
STOCKINETTE IMPERVIOUS LG (DRAPES) ×3 IMPLANT
STRIP CLOSURE SKIN 1/2X4 (GAUZE/BANDAGES/DRESSINGS) ×2 IMPLANT
SUT ETHILON 3 0 PS 1 (SUTURE) ×3 IMPLANT
SUT MNCRL AB 3-0 PS2 27 (SUTURE) ×3 IMPLANT
TAPE HYPAFIX 4 X10 (GAUZE/BANDAGES/DRESSINGS) ×3 IMPLANT
TOWEL OR 17X24 6PK STRL BLUE (TOWEL DISPOSABLE) ×3 IMPLANT
TUBE CONNECTING 12'X1/4 (SUCTIONS) ×3
TUBE CONNECTING 12X1/4 (SUCTIONS) ×6 IMPLANT
WATER STERILE IRR 500ML POUR (IV SOLUTION) ×3 IMPLANT

## 2016-08-22 NOTE — H&P (Addendum)
ORTHOPAEDIC h and P  REQUESTING PHYSICIAN: Richard Kida, MD  PCP:  Devra Dopp, MD  Chief Complaint: Left shoulder pain  HPI: [name] is a 56 y.o. male who complains of  Left shoulder pain that has been recalcitrant to conservative measures.  He has had injections and PT for the last few months and presents today for surgical management.  On MRI he has AC arthrosis, with subacromial spurring and subdeltoid bursitis with rotator cuff tendinosis.  He has no new symptoms today but is ready for his surgery.  Past Medical History:  Diagnosis Date  . BPH (benign prostatic hyperplasia)   . CKD (chronic kidney disease), stage III    pt states he's unaware  . History of CVA in adulthood    09-20-2010  acute right superior cerebellar infarct/ pt denies deficits  . HTN (hypertension)    no meds x2 mos  . Hyperlipidemia   . Incomplete rotator cuff tear    left shoulder  . Mild intermittent asthma   . OA (osteoarthritis)    AC  joint left shoulder   . PFO (patent foramen ovale)    small patent PFO w/ left to right flow per TEE 12-09-2010  . Renal cyst, right    simple per ultrasound 11-19-2015  . Shoulder impingement, left   . Sleep apnea    Past Surgical History:  Procedure Laterality Date  . ABDOMINAL SURGERY  1984   puncture wound; accident (chopping wood)  . TRANSESOPHAGEAL ECHOCARDIOGRAM  12/09/2010   normal LVF ;  small patent foreman ovale (PFO) by color doppler w/ left to right flow, with contrast injection there were a few large bubbles in left atrium/  trivial MR   Social History   Social History  . Marital status: Single    Spouse name: n/a  . Number of children: 3  . Years of education: associates   Occupational History  . production operator [employer]   Social History Main Topics  . Smoking status: Never Smoker  . Smokeless tobacco: Never Used  . Alcohol use No  . Drug use: No  . Sexual activity: Not Asked   Other Topics  Concern  . None   Social History Narrative   His father lives with him.   Adult children live independently, 2 in [place], one in [place]   Family History  Problem Relation Age of Onset  . Diabetes Mother   . Hypertension Mother   . Heart attack Mother 55  . Diabetes Sister   . Alcohol abuse Brother    No Known Allergies Prior to Admission medications   Medication Sig Start Date End Date Taking? Authorizing Provider  albuterol (PROVENTIL HFA) 108 (90 BASE) MCG/ACT inhaler Inhale 2 puffs into the lungs daily as needed.  10/20/13  Yes Historical Provider, MD  aspirin 81 MG tablet Take 81 mg by mouth daily.   Yes Historical Provider, MD  Cetirizine HCl (ZYRTEC ALLERGY) 10 MG CAPS Take 1 capsule (10 mg total) by mouth daily. 12/02/15  Yes Kristen N Ward, DO  fluticasone (FLONASE) 50 MCG/ACT nasal spray Place 2 sprays into both nostrils daily. 12/02/15  Yes Kristen N Ward, DO  benzonatate (TESSALON) 100 MG capsule Take 1 capsule (100 mg total) by mouth every 8 (eight) hours. 09/19/15   Pricilla Loveless, MD   No results found.  Positive ROS: All other systems have been reviewed and were otherwise negative with the exception of those mentioned in the HPI and  as above.  Physical Exam: General: Alert, no acute distress Cardiovascular: No pedal edema Respiratory: No cyanosis, no use of accessory musculature GI: No organomegaly, abdomen is soft and non-tender Skin: No lesions in the area of chief complaint Neurologic: Sensation intact distally Psychiatric: Patient is competent for consent with normal mood and affect Lymphatic: No axillary or cervical lymphadenopathy   Assessment: Left shoulder AC joint arthritis, subacromial impingement and rotator cuff tendonitis  Plan: -to OR today for left shoulder arthroscopy with subacromial decompression, distal clavicle resection and possible rotator cuff repair, possible biceps tenodesis -all questions answered -dc home from PACU -risks and  benefits have been reviewed at length with the patient.    Richard KidaJason Patrick Rogers, MD Cell [phone]    08/22/2016 7:10 AM

## 2016-08-22 NOTE — Addendum Note (Signed)
Addendum  created 08/22/16 1230 by Mal AmabileMichael Anand Tejada, MD   Anesthesia Attestations filed

## 2016-08-22 NOTE — Op Note (Addendum)
Date of Surgery: 08/22/2016  INDICATIONS: [name] a 56 y.o.-year-old male with a left  left shoulder superior labrum tear, subacromial impingement, biceps tendinitis, and before meals joint arthrosis.   The patient did consent to the procedure after discussion of the risks and benefits.  Prior to proceeding with surgical management options [name] had failed conservative treatment this consisted of oral anti-inflammatories as physical therapy as well as injections.  PREOPERATIVE DIAGNOSIS: 1. Left shoulder impingement, subacromial 2. SLAP tear left shoulder 3. Left shoulder biceps tendinitis 4. Left shoulder AC joint arthritis  POSTOPERATIVE DIAGNOSIS:  Same  PROCEDURE:  1. Left shoulder arthroscopy with extensive debridement 2. Left shoulder arthroscopic biceps tenodesis 3. Left shoulder arthroscopic subacromial decompression 4. Left shoulder arthroscopic distal clavicle resection   SURGEON: Yolonda Kida, M.D.  ASSIST: None.  ANESTHESIA:  general  IV FLUIDS AND URINE: See anesthesia.  ESTIMATED BLOOD LOSS: 30 mL.  IMPLANTS: Arthrex biotenodesis screw x 1  DRAINS: None   COMPLICATIONS: None.  DESCRIPTION OF PROCEDURE: The patient was brought to the operating room and placed left lateral decubitus  on the operating table.  The patient had been signed prior to the procedure and this was documented. The patient had the anesthesia placed by the anesthesiologist.  A time-out was performed to confirm that this was the correct patient, site, side and location. The patient did receive antibiotics prior to the incision and was re-dosed during the procedure as needed at indicated intervals.  A tourniquet was not  placed.  The patient had the operative extremity prepped and draped in the standard surgical fashion.     Diagnostic arthroscopy was performed and the intra-articular shoulder. The left shoulder was entered atraumatically through the posterior viewing  portal. Diagnostic arthroscopy demonstrated extensive tearing of the superior and anterior labrum consistent with SLAP tear there was also extensive synovitis in the rotator interval. The supraspinatus tendon insertion was intact without tearing. The infraspinatus teres minor and subscapularis were all intact without tears. Glenoid and humeral head had grade 0 cartilage.  Next we established mid glenoid working portal through the rotator interval. After establishment, the use of the radiofrequency wand and motorized shaver were used to extensively debride the shoulder joint. This included rotator interval resection of synovitic tissue as well as debridement of the anterior labrum and superior labrum and posterior labrum.  Next the biceps tendon was tenotomized using the scissors. The labrum stump was debrided back to a small less prominent piece of tissue.  We next turned our attention to the subacromial space. The camera was removed from the shoulder joint atraumatically into the subacromial space through the posterior portal. At this point in time there was extensive bursitis noted and inflammatory tissue. We established a lateral working portal using the motorized shaver and radiofrequency device extensive bursectomy was performed subacromial space. On the bursal side the rotator cuff tendon was completely intact.   We next turned our attention to the biceps tenodesis.  The biceps groove was dissected out using the RF wand. Next I controlled the biceps tendon found in the groove and exteriorized that through an accessory anterior portal. Using a #2 FiberWire stitch the biceps tendon was secured in a Krakw manner.  The free ends of the suture were then loaded into the biceps tenodesis screw. Next a pin was used to place in the inferior aspect of the biceps groove for our screw placement. Cannulated drill bit was used to drill unit cortically into the biceps groove. I  debrided any loose bone and soft tissue  around the drill hole to prepare for insertion of our tendon and screw. Next we inserted the screw under appropriate tension into the predrilled hole. The biceps tendon was noted to be in the distal aspect of the groove and adhered to the tenodesis screw.   We next turned our attention to defining the subacromial anatomy. He had a large sized anterior lateral bony prominence on the undersurface of the acromion. This was smoothed using the motorized bur with the objective change from a type II to a type I acromion. Once this was adequately accomplished. Frequency device was used to achieve hemostasis final pictures were taken.  Finally we turned our attention to the distal clavicle resection. First utilizing the RF wand to define the anatomy we dissected and cleared the before meals joint disc as well as anterior and inferior capsule. The bur was then introduced to resect proximally 7 mm of distal clavicle. Care was taken to maintain the superior posterior capsular structures. Once again the ORIF 1 was used to achieve hemostasis and debride any remaining soft tissue from the undersurface or intersection of the acromion and distal clavicle. Pictures were taken.   All instruments were removed from the shoulder portals were closed with interrupted 3-0 Monocryl. The arm was cleaned one final time sterile dressings were applied. The left shoulder was placed in a shoulder sling.  The patient tolerated the procedure well and there were no immediate complications. Counts were taken twice and were correct each time. Patient was transferred to PACU in stable condition.  POSTOPERATIVE PLAN: Richard FishMichael R Ross will be in a sling to the left arm for approximately 4 weeks. He may remove the arm for elbow hand and wrist range of motion exercises immediately. He may not lift more than 5 pounds until cleared by his therapist. He will be seen in physical therapy in 1 week to begin working on biceps tenodesis rehabilitation.  We will discharge him home from PACU.

## 2016-08-22 NOTE — Discharge Instructions (Signed)

## 2016-08-22 NOTE — Anesthesia Procedure Notes (Signed)
Procedure Name: Intubation Date/Time: 08/22/2016 7:46 AM Performed by: Rica RecordsICKELTON, Yarieliz Wasser Pre-anesthesia Checklist: Patient identified, Emergency Drugs available, Suction available and Patient being monitored Patient Re-evaluated:Patient Re-evaluated prior to inductionOxygen Delivery Method: Circle system utilized Preoxygenation: Pre-oxygenation with 100% oxygen Intubation Type: IV induction Ventilation: Mask ventilation without difficulty Laryngoscope Size: Miller and 2 Grade View: Grade II Tube type: Oral Number of attempts: 1 Airway Equipment and Method: Stylet Placement Confirmation: ETT inserted through vocal cords under direct vision,  positive ETCO2 and breath sounds checked- equal and bilateral Secured at: 24 cm Tube secured with: Tape Dental Injury: Teeth and Oropharynx as per pre-operative assessment

## 2016-08-22 NOTE — Anesthesia Procedure Notes (Addendum)
Anesthesia Regional Block:  Interscalene brachial plexus block  Pre-Anesthetic Checklist: ,, timeout performed, Correct Patient, Correct Site, Correct Laterality, Correct Procedure, Correct Position, site marked, Risks and benefits discussed,  Surgical consent,  Pre-op evaluation,  At surgeon's request and post-op pain management  Laterality: Left  Prep: chloraprep       Needles:   Needle Type: Echogenic Stimulator Needle     Needle Length: 9cm 9 cm Needle Gauge: 21 and 21 G  Needle insertion depth: 5 cm   Additional Needles: Interscalene brachial plexus block Narrative:  Start time: 08/22/2016 7:14 AM End time: 08/22/2016 7:20 AM Injection made incrementally with aspirations every 5 mL.  Performed by: Personally  Anesthesiologist: Mal AmabileFOSTER, Clois  Additional Notes: Timeout performed. Patient sedated. Relevant anatomy ID'd using US. Incremental 5ml injection with frequent aspiration. Patient tolerated procedure well.

## 2016-08-22 NOTE — Anesthesia Postprocedure Evaluation (Signed)
Anesthesia Post Note  Patient: [name]  Procedure(s) Performed: Procedure(s) (LRB): LEFT SHOULDER ARTHROSCOPY, SUBACROBIAL DECOMPRESSION, DISTAL CLAVICLE EXCISION (Left) BICEP TENODESIS (Left)  Patient location during evaluation: PACU Anesthesia Type: Regional Level of consciousness: awake and alert and oriented Pain management: pain level controlled Vital Signs Assessment: post-procedure vital signs reviewed and stable Respiratory status: spontaneous breathing, nonlabored ventilation and respiratory function stable Cardiovascular status: blood pressure returned to baseline and stable Postop Assessment: no signs of nausea or vomiting Anesthetic complications: no Comments: No pain- Good sensory block.       Last Vitals:  Vitals:   08/22/16 1011 08/22/16 1015  BP: 134/78 (!) 143/76  Pulse: 86 75  Resp: 19 14  Temp: 36.4 C     Last Pain:  Vitals:   08/22/16 0610  TempSrc:   PainSc: 4                  Trey Bebee,Jabbar A.

## 2016-08-22 NOTE — Transfer of Care (Signed)
Immediate Anesthesia Transfer of Care Note  Patient: [name]  Procedure(s) Performed: Procedure(s) with comments: LEFT SHOULDER ARTHROSCOPY, SUBACROBIAL DECOMPRESSION, DISTAL CLAVICLE EXCISION (Left) - Requests 2 hours BICEP TENODESIS (Left)  Patient Location: PACU  Anesthesia Type:General  Level of Consciousness: awake, alert  and oriented  Airway & Oxygen Therapy: Patient Spontanous Breathing and Patient connected to nasal cannula oxygen  Post-op Assessment: Report given to RN and Post -op Vital signs reviewed and stable  Post vital signs: Reviewed and stable  Last Vitals:  Vitals:   08/22/16 1030 08/22/16 1045  BP: (!) 147/78 134/78  Pulse: 80 86  Resp: 14 17  Temp:      Last Pain:  Vitals:   08/22/16 0610  TempSrc:   PainSc: 4       Patients Stated Pain Goal: 5 (08/22/16 0610)  Complications: No apparent anesthesia complications

## 2016-08-22 NOTE — Brief Op Note (Signed)
08/22/2016  10:15 AM  PATIENT:  [name]  56 y.o. male  PRE-OPERATIVE DIAGNOSIS:  Left shoulder AC arthritis, subacromial impingement, partial cuff tear  POST-OPERATIVE DIAGNOSIS:  Left shoulder AC arthritis, subacromial impingement, partial cuff tear  PROCEDURE:  Procedure(s) with comments: LEFT SHOULDER ARTHROSCOPY, SUBACROBIAL DECOMPRESSION, DISTAL CLAVICLE EXCISION (Left) - Requests 2 hours BICEP TENODESIS (Left)  SURGEON:  Surgeon(s) and Role:    * Yolonda KidaJason Patrick Rogers, MD - Primary  PHYSICIAN ASSISTANT:   ASSISTANTS: none   ANESTHESIA:   regional and general  EBL:  Total I/O In: 1000 [I.V.:1000] Out: 15 [Blood:15]  BLOOD ADMINISTERED:none  DRAINS: none   LOCAL MEDICATIONS USED:  NONE  SPECIMEN:  No Specimen  DISPOSITION OF SPECIMEN:  N/A  COUNTS:  YES  TOURNIQUET:  * No tourniquets in log *  DICTATION: .Note written in EPIC  PLAN OF CARE: Discharge to home after PACU  PATIENT DISPOSITION:  PACU - hemodynamically stable.   Delay start of Pharmacological VTE agent (>24hrs) due to surgical blood loss or risk of bleeding: not applicable

## 2016-08-26 ENCOUNTER — Encounter (HOSPITAL_BASED_OUTPATIENT_CLINIC_OR_DEPARTMENT_OTHER): Payer: Self-pay | Admitting: Orthopedic Surgery

## 2016-10-12 IMAGING — US US RENAL
1 series · 14 of 25 positions shown · non-contrast
Comparison: None.

CLINICAL DATA: 54-year-old male with stage 3 chronic kidney disease

EXAM:
RENAL / URINARY TRACT ULTRASOUND COMPLETE

[Series 1: us renal · 0.28mm/px · 14 of 40 slices shown]
[im 1/40]
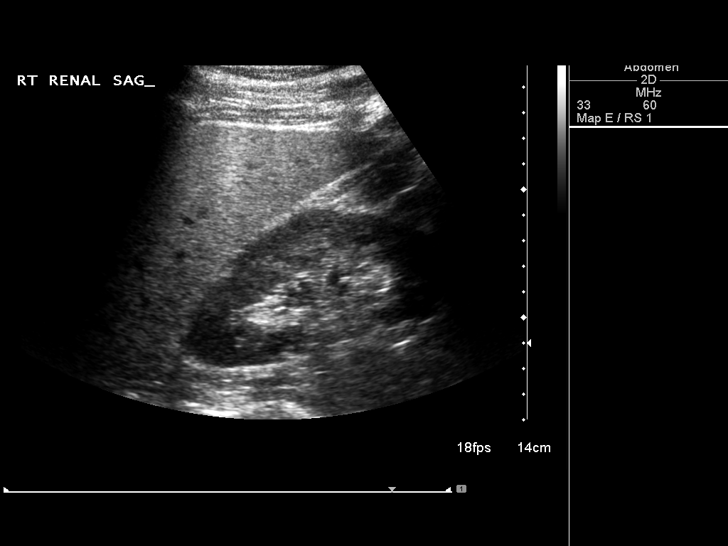
[im 4/40]
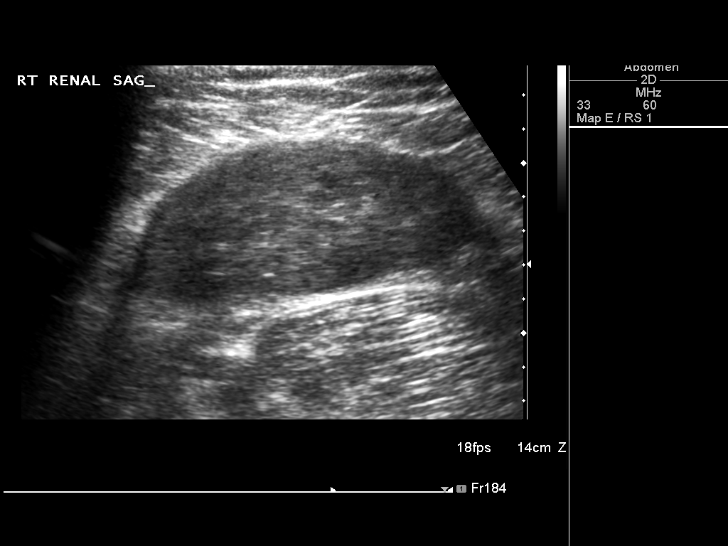
[im 7/40]
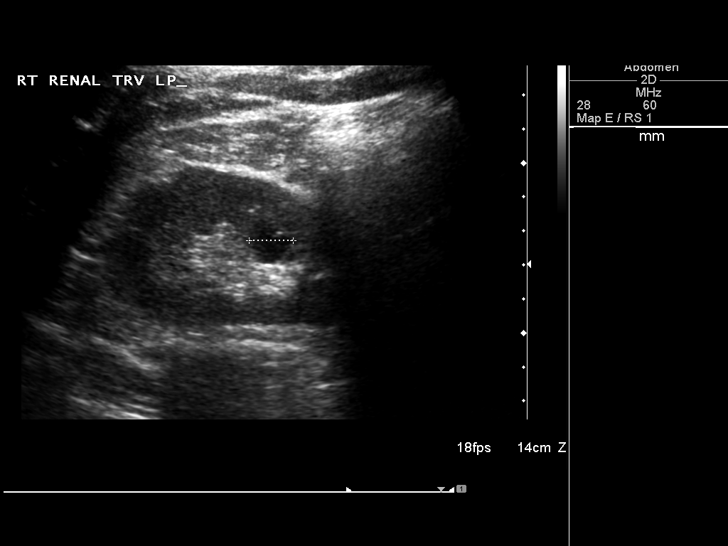
[im 10/40]
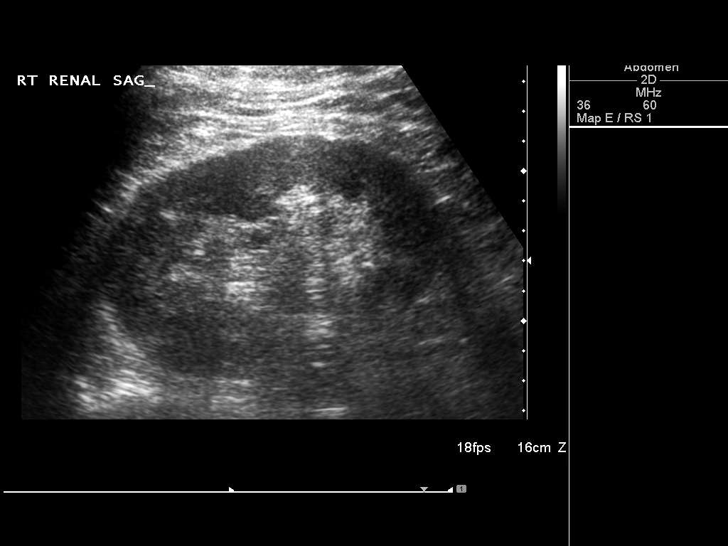
[im 14/40]
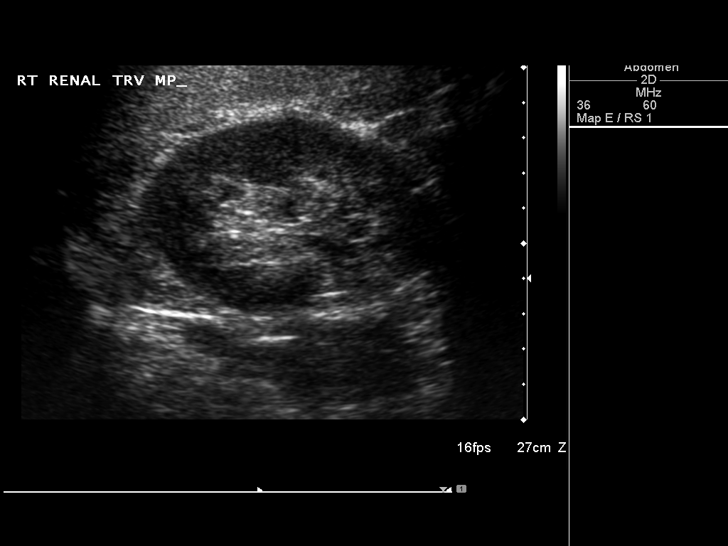
[im 15/40]
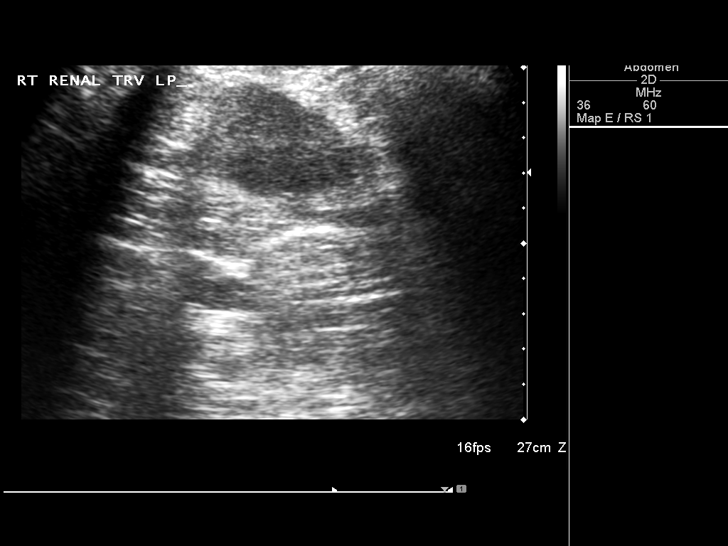
[im 18/40]
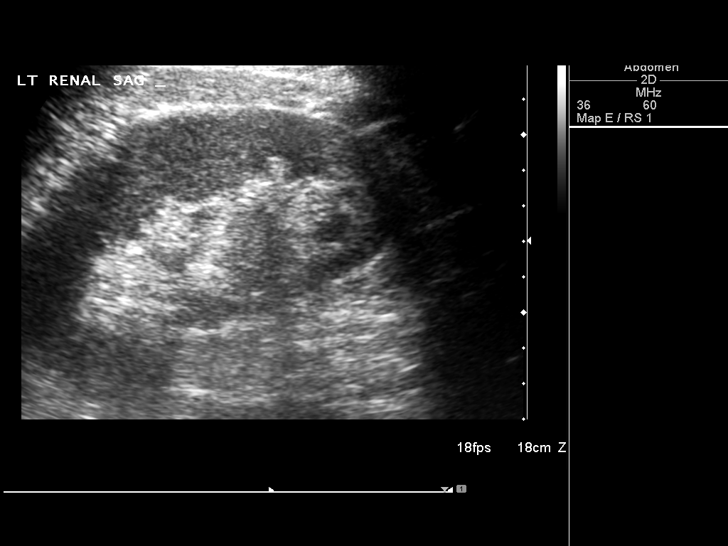
[im 22/40]
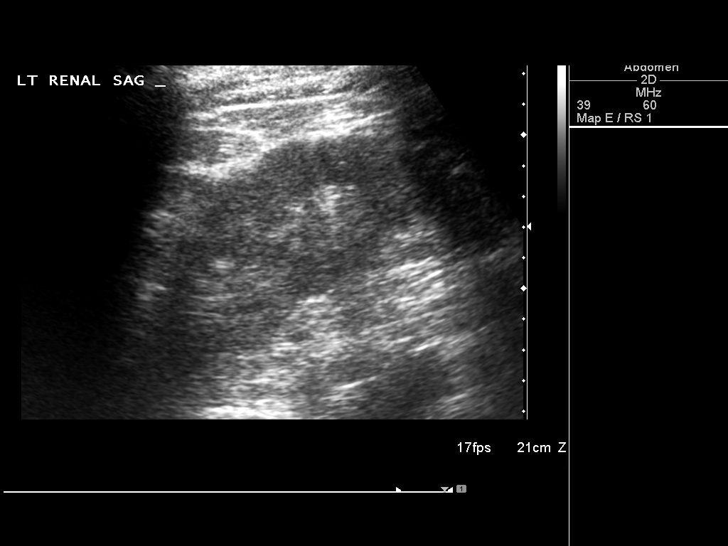
[im 25/40]
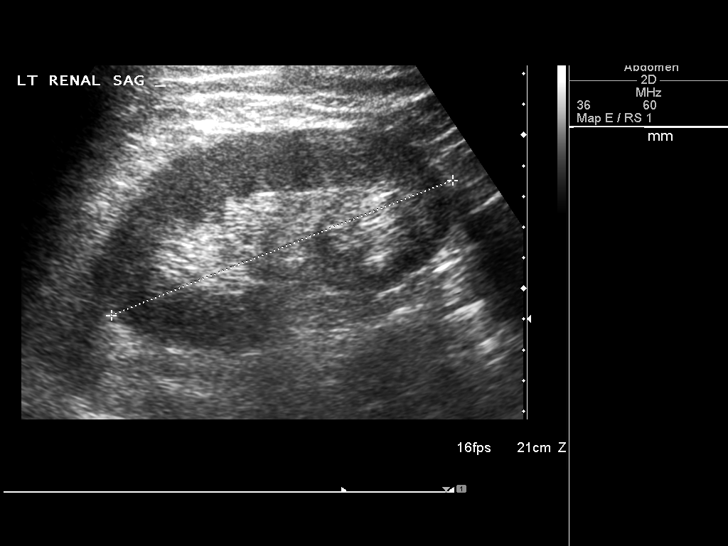
[im 27/40]
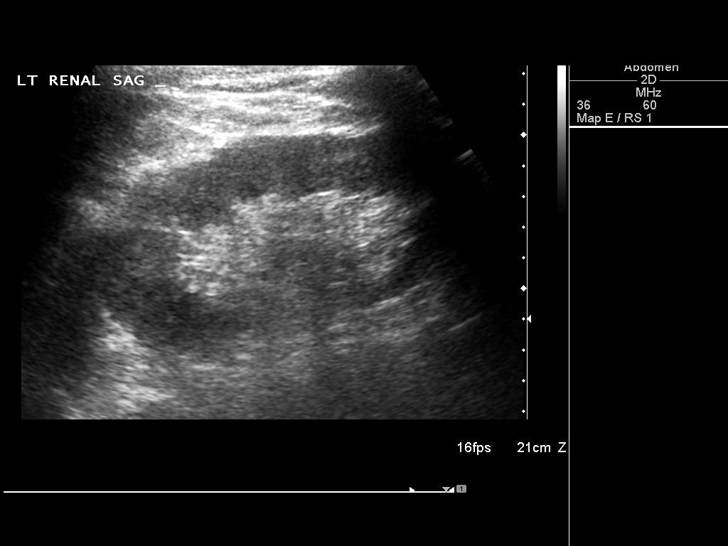
[im 30/40]
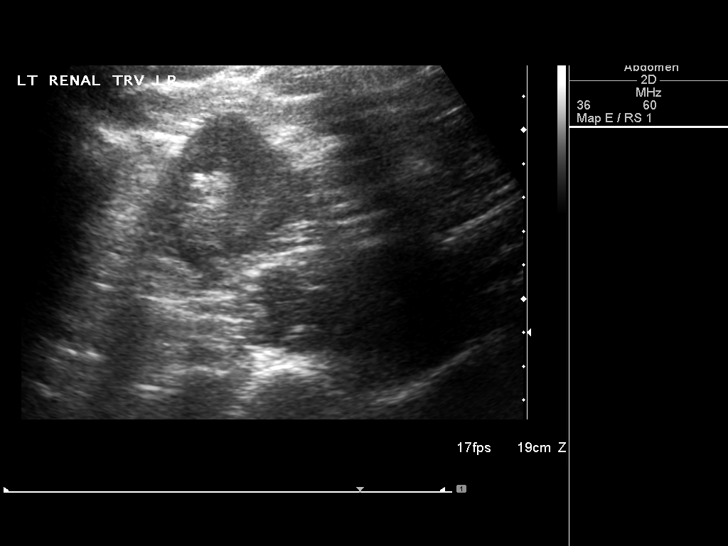
[im 33/40]
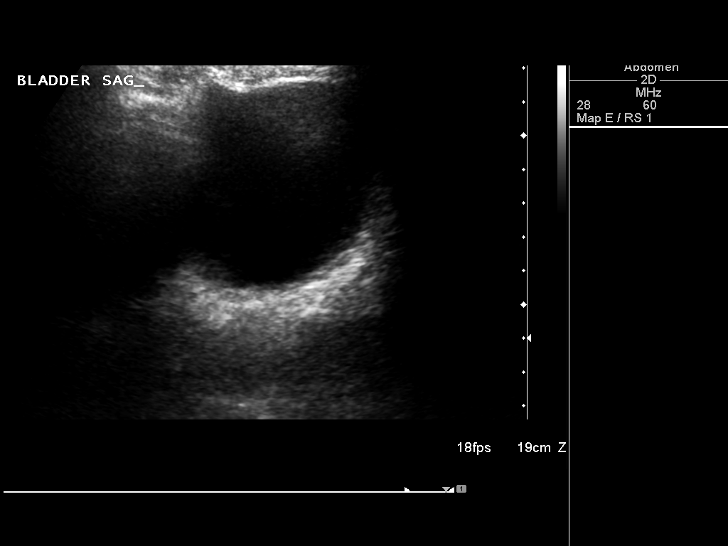
[im 36/40]
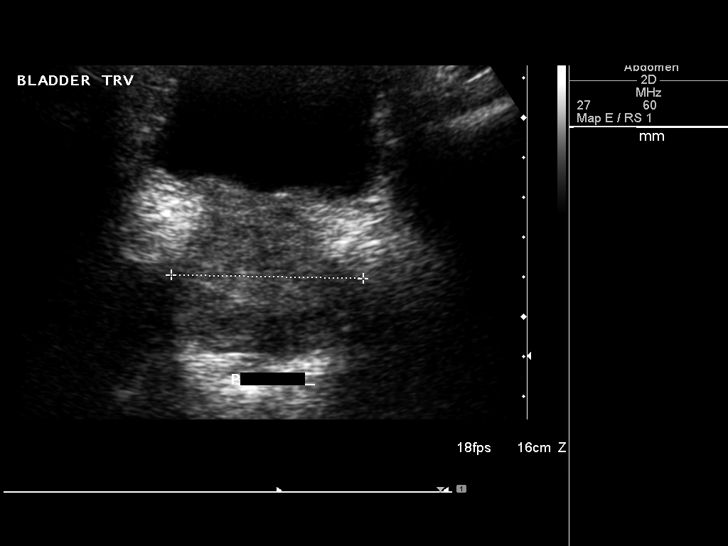
[im 40/40]
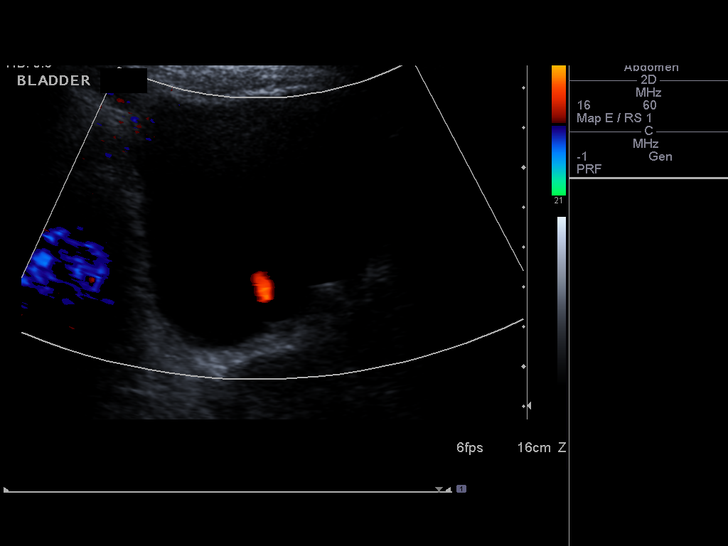

[14 of 25 positions shown; findings below may reference images not displayed]

FINDINGS: Right Kidney:

Length: 11.6 cm. Echogenicity within normal limits. No solid mass or
hydronephrosis visualized. Anechoic cystic structure in the right
upper pole measures 1.1 x 1.4 x 1.3 cm. There is an imperceptible
wall and posterior acoustic enhancement. Sonographic features are
consistent with a simple cyst.

Left Kidney:

Length: 12.1 cm. Echogenicity within normal limits. No mass or
hydronephrosis visualized.

Bladder:

Appears normal for degree of bladder distention. Enlarged prostate
gland.

Other: Echogenic hepatic parenchyma with coarsening of the
echotexture. The adjacent renal parenchyma appears hypoechoic in
comparison. Findings suggest fatty infiltration.
IMPRESSION: 1. No evidence of hydronephrosis.
2. Hepatic steatosis.
3. Simple cyst in the upper pole of the right kidney.
4. Prostatomegaly.

## 2017-05-20 ENCOUNTER — Emergency Department (HOSPITAL_BASED_OUTPATIENT_CLINIC_OR_DEPARTMENT_OTHER)
Admission: EM | Admit: 2017-05-20 | Discharge: 2017-05-20 | Disposition: A | Discharge status: Home / Self Care | Payer: 59 | Attending: Emergency Medicine | Admitting: Emergency Medicine

## 2017-05-20 ENCOUNTER — Encounter (HOSPITAL_BASED_OUTPATIENT_CLINIC_OR_DEPARTMENT_OTHER): Payer: Self-pay | Admitting: Emergency Medicine

## 2017-05-20 ENCOUNTER — Emergency Department (HOSPITAL_BASED_OUTPATIENT_CLINIC_OR_DEPARTMENT_OTHER): Payer: 59

## 2017-05-20 DIAGNOSIS — J069 Acute upper respiratory infection, unspecified: Secondary | ICD-10-CM | POA: Diagnosis not present

## 2017-05-20 DIAGNOSIS — I129 Hypertensive chronic kidney disease with stage 1 through stage 4 chronic kidney disease, or unspecified chronic kidney disease: Secondary | ICD-10-CM | POA: Diagnosis not present

## 2017-05-20 DIAGNOSIS — B349 Viral infection, unspecified: Secondary | ICD-10-CM | POA: Insufficient documentation

## 2017-05-20 DIAGNOSIS — J9801 Acute bronchospasm: Secondary | ICD-10-CM

## 2017-05-20 DIAGNOSIS — Z79899 Other long term (current) drug therapy: Secondary | ICD-10-CM | POA: Insufficient documentation

## 2017-05-20 DIAGNOSIS — N183 Chronic kidney disease, stage 3 (moderate): Secondary | ICD-10-CM | POA: Diagnosis not present

## 2017-05-20 DIAGNOSIS — Z7982 Long term (current) use of aspirin: Secondary | ICD-10-CM | POA: Diagnosis not present

## 2017-05-20 DIAGNOSIS — R05 Cough: Secondary | ICD-10-CM | POA: Diagnosis present

## 2017-05-20 DIAGNOSIS — Z8673 Personal history of transient ischemic attack (TIA), and cerebral infarction without residual deficits: Secondary | ICD-10-CM | POA: Diagnosis not present

## 2017-05-20 DIAGNOSIS — E785 Hyperlipidemia, unspecified: Secondary | ICD-10-CM | POA: Insufficient documentation

## 2017-05-20 MED ORDER — PREDNISONE 20 MG PO TABS: 60.0000 mg | ORAL_TABLET | Freq: Every day | ORAL | 0 refills | Status: DC

## 2017-05-20 MED ORDER — BENZONATATE 100 MG PO CAPS: 100.0000 mg | ORAL_CAPSULE | Freq: Three times a day (TID) | ORAL | 0 refills | Status: DC | PRN

## 2017-05-20 MED ORDER — ALBUTEROL SULFATE (2.5 MG/3ML) 0.083% IN NEBU
2.5000 mg | INHALATION_SOLUTION | Freq: Once | RESPIRATORY_TRACT | Status: AC
  Administered 2017-05-20: 2.5 mg via RESPIRATORY_TRACT
  Filled 2017-05-20: qty 3

## 2017-05-20 MED ORDER — ALBUTEROL SULFATE HFA 108 (90 BASE) MCG/ACT IN AERS
2.0000 | INHALATION_SPRAY | Freq: Once | RESPIRATORY_TRACT | Status: AC
  Administered 2017-05-20: 2 via RESPIRATORY_TRACT
  Filled 2017-05-20: qty 6.7

## 2017-05-20 MED ORDER — PREDNISONE 50 MG PO TABS
60.0000 mg | ORAL_TABLET | Freq: Once | ORAL | Status: AC
  Administered 2017-05-20: 60 mg via ORAL
  Filled 2017-05-20: qty 1

## 2017-05-20 NOTE — ED Triage Notes (Signed)
Pt reports cough and congestion x 2 weeks. Pt states he has episodes of coughing that cause him to feel faint. Pt states he ran out of his inhaler yesterday.

## 2017-05-20 NOTE — ED Provider Notes (Signed)
TIME SEEN: 3:29 AM  CHIEF COMPLAINT: cough  HPI: Pt is a 56 year old male with history of hypertension, hyperlipidemia, chronic kidney disease who presents to the emergency department with a cough for the past 2-3 weeks. Reports cough is dry, nonproductive. Had a fever "a few days ago" but none now. He has some chest soreness from coughing. Feels short of breath and like he may pass out when he begins coughing very hard. States he has been wheezing. Has had bronchospasm in the past and reports he ran out of his albuterol at home. He denies history of asthma or COPD. States that steroids previously had helped him with similar symptoms.  ROS: See HPI Constitutional: no fever  Eyes: no drainage  ENT: no runny nose   Cardiovascular:   chest pain with coughing Resp: SOB  GI: no vomiting GU: no dysuria Integumentary: no rash  Allergy: no hives  Musculoskeletal: no leg swelling  Neurological: no slurred speech ROS otherwise negative  PAST MEDICAL HISTORY/PAST SURGICAL HISTORY:  Past Medical History:  Diagnosis Date  . BPH (benign prostatic hyperplasia)   . CKD (chronic kidney disease), stage III (HCC)    pt states he's unaware  . History of CVA in adulthood    09-20-2010  acute right superior cerebellar infarct/ pt denies deficits  . HTN (hypertension)    no meds x2 mos  . Hyperlipidemia   . Incomplete rotator cuff tear    left shoulder  . Mild intermittent asthma   . OA (osteoarthritis)    AC  joint left shoulder   . PFO (patent foramen ovale)    small patent PFO w/ left to right flow per TEE 12-09-2010  . Renal cyst, right    simple per ultrasound 11-19-2015  . Shoulder impingement, left   . Sleep apnea     MEDICATIONS:  Prior to Admission medications   Medication Sig Start Date End Date Taking? Authorizing Provider  albuterol (PROVENTIL HFA) 108 (90 BASE) MCG/ACT inhaler Inhale 2 puffs into the lungs daily as needed.  10/20/13   [provider]  aspirin 81 MG  tablet Take 81 mg by mouth daily.    [provider]  benzonatate (TESSALON) 100 MG capsule Take 1 capsule (100 mg total) by mouth every 8 (eight) hours. 09/19/15   Pricilla Loveless, MD  Cetirizine HCl (ZYRTEC ALLERGY) 10 MG CAPS Take 1 capsule (10 mg total) by mouth daily. 12/02/15   Zerenity Bowron, Layla Maw, DO  fluticasone (FLONASE) 50 MCG/ACT nasal spray Place 2 sprays into both nostrils daily. 12/02/15   Sylar Voong, Layla Maw, DO  ondansetron (ZOFRAN ODT) 4 MG disintegrating tablet Take 1 tablet (4 mg total) by mouth every 8 (eight) hours as needed for nausea or vomiting. 08/22/16   Yolonda Kida, MD  oxyCODONE (ROXICODONE) 5 MG immediate release tablet Take 1-2 tablets (5-10 mg total) by mouth every 4 (four) hours as needed for moderate pain or severe pain. 08/22/16   Yolonda Kida, MD    ALLERGIES:  No Known Allergies  SOCIAL HISTORY:  Social History  Substance Use Topics  . Smoking status: Never Smoker  . Smokeless tobacco: Never Used  . Alcohol use No    FAMILY HISTORY: Family History  Problem Relation Age of Onset  . Diabetes Mother   . Hypertension Mother   . Heart attack Mother 57  . Diabetes Sister   . Alcohol abuse Brother     EXAM: BP (!) 158/88 (BP Location: Right Arm)   Pulse  60   Temp 97.9 F (36.6 C) (Oral)   Resp 18   Ht 6' (1.829 m)   Wt 106.6 kg (235 lb)   SpO2 96%   BMI 31.87 kg/m  CONSTITUTIONAL: Alert and oriented and responds appropriately to questions. Well-appearing; well-nourished HEAD: Normocephalic EYES: Conjunctivae clear, pupils appear equal, EOMI ENT: normal nose; moist mucous membranes NECK: Supple, no meningismus, no nuchal rigidity, no LAD  CARD: RRR; S1 and S2 appreciated; no murmurs, no clicks, no rubs, no gallops RESP: Normal chest excursion without splinting or tachypnea; breath sounds clear and equal bilaterally; no wheezes, no rhonchi, no rales, no hypoxia or respiratory distress, speaking full sentences ABD/GI: Normal bowel  sounds; non-distended; soft, non-tender, no rebound, no guarding, no peritoneal signs, no hepatosplenomegaly BACK:  The back appears normal and is non-tender to palpation, there is no CVA tenderness EXT: Normal ROM in all joints; non-tender to palpation; no edema; normal capillary refill; no cyanosis, no calf tenderness or swelling    SKIN: Normal color for age and race; warm; no rash NEURO: Moves all extremities equally PSYCH: The patient's mood and manner are appropriate. Grooming and personal hygiene are appropriate.  MEDICAL DECISION MAKING: Patient here with likely viral respiratory infection. We'll obtain chest x-ray to ensure no pneumonia. We'll give albuterol treatment as he feels that this would help him feel better although his lungs are clear currently and he has no hypoxia or increased work of breathing. No signs of volume overload. Doubt ACS, PE or dissection. He also reports steroids have helped before. Given he reports wheezing at home, will place on prednisone burst. We'll give prescription for Tessalon Perles for cough upon discharge.  ED PROGRESS: Patient's chest x-ray is clear. He reports feeling better after albuterol treatment. He does have slightly and decreased aeration at his bases. I feel he is safe for discharge. Has albuterol inhaler to take home with him. We'll discharge with prednisone burst and Tessalon Perles. Discussed return precautions. Has outpatient PCP for follow-up.   At this time, I do not feel there is any life-threatening condition present. I have reviewed and discussed all results (EKG, imaging, lab, urine as appropriate) and exam findings with patient/family. I have reviewed nursing notes and appropriate previous records.  I feel the patient is safe to be discharged home without further emergent workup and can continue workup as an outpatient as needed. Discussed usual and customary return precautions. Patient/family verbalize understanding and are comfortable  with this plan.  Outpatient follow-up has been provided if needed. All questions have been answered.    Aadvika Konen, Layla MawKristen N, DO 05/20/17 [phone]

## 2017-05-20 NOTE — Discharge Instructions (Addendum)
You may use your albuterol inhaler 2-4 puffs every 2-4 hours as needed for shortness of breath or wheezing. Your chest x-ray today was clear. No sign of pneumonia. You do not need to be on antibiotics.

## 2017-07-10 ENCOUNTER — Emergency Department (HOSPITAL_BASED_OUTPATIENT_CLINIC_OR_DEPARTMENT_OTHER)
Admission: EM | Admit: 2017-07-10 | Discharge: 2017-07-10 | Disposition: A | Discharge status: Home / Self Care | Payer: 59 | Attending: Emergency Medicine | Admitting: Emergency Medicine

## 2017-07-10 ENCOUNTER — Other Ambulatory Visit: Payer: Self-pay

## 2017-07-10 ENCOUNTER — Emergency Department (HOSPITAL_BASED_OUTPATIENT_CLINIC_OR_DEPARTMENT_OTHER): Payer: 59

## 2017-07-10 ENCOUNTER — Encounter (HOSPITAL_BASED_OUTPATIENT_CLINIC_OR_DEPARTMENT_OTHER): Payer: Self-pay | Admitting: *Deleted

## 2017-07-10 DIAGNOSIS — W19XXXA Unspecified fall, initial encounter: Secondary | ICD-10-CM | POA: Diagnosis not present

## 2017-07-10 DIAGNOSIS — N289 Disorder of kidney and ureter, unspecified: Secondary | ICD-10-CM | POA: Diagnosis not present

## 2017-07-10 DIAGNOSIS — Y998 Other external cause status: Secondary | ICD-10-CM | POA: Diagnosis not present

## 2017-07-10 DIAGNOSIS — Y929 Unspecified place or not applicable: Secondary | ICD-10-CM | POA: Insufficient documentation

## 2017-07-10 DIAGNOSIS — Y939 Activity, unspecified: Secondary | ICD-10-CM | POA: Insufficient documentation

## 2017-07-10 DIAGNOSIS — Z8673 Personal history of transient ischemic attack (TIA), and cerebral infarction without residual deficits: Secondary | ICD-10-CM | POA: Insufficient documentation

## 2017-07-10 DIAGNOSIS — I129 Hypertensive chronic kidney disease with stage 1 through stage 4 chronic kidney disease, or unspecified chronic kidney disease: Secondary | ICD-10-CM | POA: Insufficient documentation

## 2017-07-10 DIAGNOSIS — J45909 Unspecified asthma, uncomplicated: Secondary | ICD-10-CM | POA: Insufficient documentation

## 2017-07-10 DIAGNOSIS — S8992XA Unspecified injury of left lower leg, initial encounter: Secondary | ICD-10-CM | POA: Diagnosis present

## 2017-07-10 DIAGNOSIS — S8392XA Sprain of unspecified site of left knee, initial encounter: Secondary | ICD-10-CM | POA: Insufficient documentation

## 2017-07-10 DIAGNOSIS — Z7982 Long term (current) use of aspirin: Secondary | ICD-10-CM | POA: Insufficient documentation

## 2017-07-10 DIAGNOSIS — Z79899 Other long term (current) drug therapy: Secondary | ICD-10-CM | POA: Insufficient documentation

## 2017-07-10 DIAGNOSIS — N183 Chronic kidney disease, stage 3 (moderate): Secondary | ICD-10-CM | POA: Insufficient documentation

## 2017-07-10 MED ORDER — OXYCODONE-ACETAMINOPHEN 5-325 MG PO TABS: 1.0000 | ORAL_TABLET | ORAL | 0 refills | Status: DC | PRN

## 2017-07-10 MED ORDER — OXYCODONE-ACETAMINOPHEN 5-325 MG PO TABS
1.0000 | ORAL_TABLET | Freq: Once | ORAL | Status: AC
  Administered 2017-07-10: 1 via ORAL
  Filled 2017-07-10: qty 1

## 2017-07-10 NOTE — ED Notes (Signed)
Pt has crutches from home. None given here.

## 2017-07-10 NOTE — ED Triage Notes (Signed)
Slip on ice getting out of car last pm,  Bent left knee at odd angle

## 2017-07-10 NOTE — Discharge Instructions (Signed)
Apply ice several times a day. Use crutches as needed.   Do not take anti-inflammatory medication like ibuprofen or naproxen - they can harm your kidney.  [facility]

## 2017-07-10 NOTE — ED Provider Notes (Signed)
[facility] EMERGENCY DEPARTMENT Provider Note   CSN: [id number] Arrival date & time: 07/10/17  0604     History   Chief Complaint No chief complaint on file.   HPI [name] is a 56 y.o. male.  The history is provided by the patient.  He fell last night injuring his left knee.  He denies other injury.  He is complaining of pain at 10/10.  He has not done anything to treat his pain.  Of note, he does have history of hypertension, hyperlipidemia, chronic kidney disease stage III.  Past Medical History:  Diagnosis Date  . BPH (benign prostatic hyperplasia)   . CKD (chronic kidney disease), stage III (HCC)    pt states he's unaware  . History of CVA in adulthood    09-20-2010  acute right superior cerebellar infarct/ pt denies deficits  . HTN (hypertension)    no meds x2 mos  . Hyperlipidemia   . Incomplete rotator cuff tear    left shoulder  . Mild intermittent asthma   . OA (osteoarthritis)    AC  joint left shoulder   . PFO (patent foramen ovale)    small patent PFO w/ left to right flow per TEE 12-09-2010  . Renal cyst, right    simple per ultrasound 11-19-2015  . Shoulder impingement, left   . Sleep apnea     Patient Active Problem List   Diagnosis Date Noted  . SLAP lesion of left shoulder 08/22/2016  . Tendonitis of upper biceps tendon of left shoulder 08/22/2016  . Subacromial impingement of left shoulder 08/22/2016  . Arthritis of left acromioclavicular joint 08/22/2016  . H/O transient cerebral ischemia 07/02/2015  . 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 Benign essential HTN 05/11/2015  . Asthma 09/03/2012  . Stroke [facility](HCC)     Past Surgical History:  Procedure Laterality Date  . ABDOMINAL SURGERY  1984   puncture wound; accident (chopping wood)  . SHOULDER ARTHROSCOPY WITH ROTATOR CUFF REPAIR AND OPEN BICEPS TENODESIS Left 08/22/2016   Procedure: BICEP TENODESIS;  Surgeon: Yolonda KidaJason Patrick Rogers, MD;  [facility];  Service: Orthopedics;  Laterality: Left;  . TRANSESOPHAGEAL ECHOCARDIOGRAM  12/09/2010   normal LVF ;  small patent foreman ovale (PFO) by color doppler w/ left to right flow, with contrast injection there were a few large bubbles in left atrium/  trivial MR       Home Medications    Prior to Admission medications   Medication Sig Start Date End Date Taking? Authorizing Provider  albuterol (PROVENTIL HFA) 108 (90 BASE) MCG/ACT inhaler Inhale 2 puffs into the lungs daily as needed.  10/20/13   [provider]  aspirin 81 MG tablet Take 81 mg by mouth daily.    [provider]  benzonatate (TESSALON) 100 MG capsule Take 1 capsule (100 mg total) by mouth 3 (three) times daily as needed for cough. 05/20/17   Ward, Layla MawKristen N, DO  Cetirizine HCl (ZYRTEC ALLERGY) 10 MG CAPS Take 1 capsule (10 mg total) by mouth daily. 12/02/15   Ward, Layla MawKristen N, DO  fluticasone (FLONASE) 50 MCG/ACT nasal spray Place 2 sprays into both nostrils daily. 12/02/15   Ward, Layla MawKristen N, DO  ondansetron (ZOFRAN ODT) 4 MG disintegrating tablet Take 1 tablet (4 mg total) by mouth every 8 (eight) hours as needed for nausea or vomiting. 08/22/16   Yolonda Kidaogers, Jason Patrick, MD  oxyCODONE (ROXICODONE) 5 MG immediate release tablet Take 1-2 tablets (5-10 mg  total) by mouth every 4 (four) hours as needed for moderate pain or severe pain. 08/22/16   Yolonda Kida, MD  predniSONE (DELTASONE) 20 MG tablet Take 3 tablets (60 mg total) by mouth daily. 05/20/17   Ward, Layla Maw, DO    Family History Family History  Problem Relation Age of Onset  . Diabetes Mother   . Hypertension Mother   . Heart attack Mother 42  . Diabetes Sister   . Alcohol abuse Brother     Social History Social History   Tobacco Use  . Smoking status: Never Smoker  . Smokeless tobacco: Never Used  Substance Use Topics  . Alcohol use: No  . Drug use: No     Allergies   Patient has no known allergies.   Review of  Systems Review of Systems  All other systems reviewed and are negative.    Physical Exam Updated Vital Signs BP (!) 148/82 (BP Location: Right Arm)   Pulse 75   Temp 97.9 F (36.6 C) (Oral)   Resp 18   Ht 6' (1.829 m)   Wt 106.6 kg (235 lb)   SpO2 100%   BMI 31.87 kg/m   Physical Exam  Nursing note and vitals reviewed.  56 year old male, resting comfortably and in no acute distress. Vital signs are significant for hypertension. Oxygen saturation is 100%, which is normal. Head is normocephalic and atraumatic. PERRLA, EOMI. Oropharynx is clear. Neck is nontender and supple without adenopathy or JVD. Back is nontender and there is no CVA tenderness. Lungs are clear without rales, wheezes, or rhonchi. Chest is nontender. Heart has regular rate and rhythm without murmur. Abdomen is soft, flat, nontender without masses or hepatosplenomegaly and peristalsis is normoactive. Extremities have no cyanosis or edema.  There is no swelling or effusion of the left knee.  There is point tenderness over the superior pole of the left patella without any definite defect noted in the quadriceps tendon.  There is no instability on valgus or varus stress.  He is unable to hold the leg straight against gravity.  Pain is elicited with attempts at active knee extension and with passive knee flexion. Skin is warm and dry without rash. Neurologic: Mental status is normal, cranial nerves are intact, there are no motor or sensory deficits.  ED Treatments / Results   Radiology Dg Knee Complete 4 Views Left  Result Date: 07/10/2017 CLINICAL DATA:  Acute onset of anterior left knee pain and limited range of motion. EXAM: LEFT KNEE - COMPLETE 4+ VIEW COMPARISON:  Left knee radiographs performed 09/08/2012 FINDINGS: There is no evidence of fracture or dislocation. The joint spaces are preserved. No significant degenerative change is seen; the patellofemoral joint is grossly unremarkable in appearance. An  accessory ossicle is noted at the distal patellar tendon. No significant joint effusion is seen. The visualized soft tissues are normal in appearance. IMPRESSION: No evidence of fracture or dislocation. Electronically Signed   By: Roanna Raider M.D.   On: 07/10/2017 06:51    Procedures Procedures (including critical care time)  Medications Ordered in ED Medications  oxyCODONE-acetaminophen (PERCOCET/ROXICET) 5-325 MG per tablet 1 tablet (1 tablet Oral Given 07/10/17 1610)     Initial Impression / Assessment and Plan / ED Course  I have reviewed the triage vital signs and the nursing notes.  Pertinent labs & imaging results that were available during my care of the patient were reviewed by me and considered in my medical decision making (see chart  for details).  Left knee injury which clinically appears to be either a avulsion of the left quadriceps tendon, or possible patellar fracture.  He is being sent for x-ray.  He is given oxycodone-acetaminophen for pain.  Old records are reviewed, and he has no relevant past visits.  And is significantly improved with above-noted treatment.  X-rays show no evidence of fracture.  I still suspect partial tear of quadriceps tendon.  He is placed in a knee immobilizer and given crutches to use as needed.  [facility], so he is referred there for follow-up.  He is discharged with instructions on ice and elevation, prescription given for oxycodone-acetaminophen.  Advised not to use NSAIDs because of history of chronic kidney disease.  Final Clinical Impressions(s) / ED Diagnoses   Final diagnoses:  Sprain of left knee, unspecified ligament, initial encounter  Renal insufficiency    ED Discharge Orders        Ordered    oxyCODONE-acetaminophen (PERCOCET) 5-325 MG tablet  Every 4 hours PRN     07/10/17 0656       Dione BoozeGlick, Marks Scalera, MD 07/10/17 [phone]

## 2017-07-11 ENCOUNTER — Other Ambulatory Visit: Payer: Self-pay

## 2017-07-11 ENCOUNTER — Emergency Department (HOSPITAL_COMMUNITY)
Admission: EM | Admit: 2017-07-11 | Discharge: 2017-07-11 | Disposition: A | Discharge status: Home / Self Care | Payer: 59 | Attending: Emergency Medicine | Admitting: Emergency Medicine

## 2017-07-11 DIAGNOSIS — M25562 Pain in left knee: Secondary | ICD-10-CM | POA: Diagnosis not present

## 2017-07-11 DIAGNOSIS — J45909 Unspecified asthma, uncomplicated: Secondary | ICD-10-CM | POA: Diagnosis not present

## 2017-07-11 DIAGNOSIS — Z7982 Long term (current) use of aspirin: Secondary | ICD-10-CM | POA: Insufficient documentation

## 2017-07-11 DIAGNOSIS — N183 Chronic kidney disease, stage 3 (moderate): Secondary | ICD-10-CM | POA: Insufficient documentation

## 2017-07-11 DIAGNOSIS — Z79899 Other long term (current) drug therapy: Secondary | ICD-10-CM | POA: Diagnosis not present

## 2017-07-11 DIAGNOSIS — I129 Hypertensive chronic kidney disease with stage 1 through stage 4 chronic kidney disease, or unspecified chronic kidney disease: Secondary | ICD-10-CM | POA: Insufficient documentation

## 2017-07-11 MED ORDER — FENTANYL CITRATE (PF) 100 MCG/2ML IJ SOLN
100.0000 ug | Freq: Once | INTRAMUSCULAR | Status: AC
  Administered 2017-07-11: 100 ug via INTRAMUSCULAR
  Filled 2017-07-11: qty 2

## 2017-07-11 MED ORDER — FENTANYL CITRATE (PF) 100 MCG/2ML IJ SOLN: 100.0000 ug | Freq: Once | INTRAMUSCULAR | Status: DC

## 2017-07-11 MED ORDER — DOCUSATE SODIUM 250 MG PO CAPS: 250.0000 mg | ORAL_CAPSULE | Freq: Every day | ORAL | 0 refills | Status: DC

## 2017-07-11 NOTE — ED Triage Notes (Signed)
Pt presents today with extreme left knee pain after twisting his leg after slipping on snow 2 days ago. Went to urgent care yesterday and was told to follow [facility]. Pain has since gotten worse. Visibly red and warm to touch in comparison to right knee. Patient also presents with low grade temp of 99.2.

## 2017-07-11 NOTE — ED Provider Notes (Signed)
[facility]-EMERGENCY DEPT Provider Note   CSN: [id number] Arrival date & time: 07/11/17  2049     History   Chief Complaint Chief Complaint  Patient presents with  . Knee Pain    HPI [name] is a 56 y.o. male with history of CKD, HTN, HLD, and OA presents with chief complaint acute onset, progressively worsening left knee pain for 3 days.  He states that on 07/09/14 at around 8 or 9 PM he slipped on some ice and hyperextended his left knee.  He denies head injury or loss of consciousness.  He was seen and evaluated at [facility] yesterday and x-rays showed no evidence of fracture or dislocation.  His examination was concerning for partial avulsion of the left quadriceps tendon and he was placed in a knee immobilizer and was given 20 tablets of Percocet 5-325 mg.  He states that since then he has been experiencing worsening pain and swelling.  He states pain is constant, throbbing, and sharp localized to the superior aspect of the left knee which does not radiate.  He states that his knee is otherwise nontender.  Pain worsens with movement and palpation.  He states that Percocet has not been helpful.  He has been trying ice at home with mild relief.  He is unable to bear weight on the extremity.  Denies numbness, tingling, fevers (but states he has felt subjectively warm at home), nausea, vomiting, headaches, or neck pain. No history of diabetes, immunocompromise, or IV drug use.  He is a patient of Dr. Aundria Rudogers with [facility].  The history is provided by the patient.    Past Medical History:  Diagnosis Date  . BPH (benign prostatic hyperplasia)   . CKD (chronic kidney disease), stage III (HCC)    pt states he's unaware  . History of CVA in adulthood    09-20-2010  acute right superior cerebellar infarct/ pt denies deficits  . HTN (hypertension)    no meds x2 mos  . Hyperlipidemia   . Incomplete rotator cuff tear    left shoulder  .  Mild intermittent asthma   . OA (osteoarthritis)    AC  joint left shoulder   . PFO (patent foramen ovale)    small patent PFO w/ left to right flow per TEE 12-09-2010  . Renal cyst, right    simple per ultrasound 11-19-2015  . Shoulder impingement, left   . Sleep apnea     Patient Active Problem List   Diagnosis Date Noted  . SLAP lesion of left shoulder 08/22/2016  . Tendonitis of upper biceps tendon of left shoulder 08/22/2016  . Subacromial impingement of left shoulder 08/22/2016  . Arthritis of left acromioclavicular joint 08/22/2016  . H/O transient cerebral ischemia 07/02/2015  . 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 Benign essential HTN 05/11/2015  . Asthma 09/03/2012  . Stroke [facility](HCC)     Past Surgical History:  Procedure Laterality Date  . ABDOMINAL SURGERY  1984   puncture wound; accident (chopping wood)  . SHOULDER ARTHROSCOPY WITH ROTATOR CUFF REPAIR AND OPEN BICEPS TENODESIS Left 08/22/2016   Procedure: BICEP TENODESIS;  Surgeon: Yolonda KidaJason Patrick Rogers, MD;  [facility];  Service: Orthopedics;  Laterality: Left;  . TRANSESOPHAGEAL ECHOCARDIOGRAM  12/09/2010   normal LVF ;  small patent foreman ovale (PFO) by color doppler w/ left to right flow, with contrast injection there were a few large bubbles in left atrium/  trivial MR  Home Medications    Prior to Admission medications   Medication Sig Start Date End Date Taking? Authorizing Provider  albuterol (PROVENTIL HFA) 108 (90 BASE) MCG/ACT inhaler Inhale 2 puffs into the lungs daily as needed.  10/20/13   [provider]  aspirin 81 MG tablet Take 81 mg by mouth daily.    [provider]  Cetirizine HCl (ZYRTEC ALLERGY) 10 MG CAPS Take 1 capsule (10 mg total) by mouth daily. 12/02/15   Ward, Layla Maw, DO  docusate sodium (COLACE) 250 MG capsule Take 1 capsule (250 mg total) by mouth daily. 07/11/17   Arieon Corcoran A, PA-C  fluticasone (FLONASE) 50 MCG/ACT nasal  spray Place 2 sprays into both nostrils daily. 12/02/15   Ward, Layla Maw, DO  oxyCODONE-acetaminophen (PERCOCET) 5-325 MG tablet Take 1 tablet by mouth every 4 (four) hours as needed for moderate pain. 07/10/17   Dione Booze, MD    Family History Family History  Problem Relation Age of Onset  . Diabetes Mother   . Hypertension Mother   . Heart attack Mother 34  . Diabetes Sister   . Alcohol abuse Brother     Social History Social History   Tobacco Use  . Smoking status: Never Smoker  . Smokeless tobacco: Never Used  Substance Use Topics  . Alcohol use: No  . Drug use: No     Allergies   Patient has no known allergies.   Review of Systems Review of Systems  Respiratory: Negative for shortness of breath.   Cardiovascular: Negative for chest pain.  Gastrointestinal: Negative for abdominal pain, nausea and vomiting.  Musculoskeletal: Positive for arthralgias (Left knee).  Neurological: Positive for weakness. Negative for syncope, numbness and headaches.  All other systems reviewed and are negative.    Physical Exam Updated Vital Signs BP (!) 150/96   Pulse 81   Temp 99.2 F (37.3 C)   SpO2 95%   Physical Exam  Constitutional: He is oriented to person, place, and time. He appears well-developed and well-nourished. No distress.  HENT:  Head: Normocephalic and atraumatic.  Eyes: Conjunctivae are normal. Right eye exhibits no discharge. Left eye exhibits no discharge.  Neck: No JVD present. No tracheal deviation present.  Cardiovascular: Normal rate and intact distal pulses.  2+ DP/PT pulses bilaterally, no calf tenderness, no lower extremity edema  Pulmonary/Chest: Effort normal.  Abdominal: Soft. He exhibits no distension. There is no tenderness.  Musculoskeletal: He exhibits edema and tenderness.  Unable to hold left lower extremity straight against gravity, significantly decreased range of motion of the left knee secondary to pain.  There is swelling and point  tenderness noted superior to the patella.  Anterior thigh tenderness present. no varus or valgus instability.  No tenderness to palpation of the patella, popliteal fossa, medial or lateral joint lines, MCL, LCL, or tibial tuberosity.  Examination of left hip flexors are limited secondary to knee pain but otherwise 5/5 strength of BLE major muscle groups.  Neurological: He is alert and oriented to person, place, and time. No sensory deficit. He exhibits normal muscle tone.  Fluent speech, no facial droop, sensation intact to soft touch of bilateral lower extremities, unable to assess gait secondary to pain  Skin: Skin is warm and dry. No erythema.  Psychiatric: He has a normal mood and affect. His behavior is normal.  Nursing note and vitals reviewed.    ED Treatments / Results  Labs (all labs ordered are listed, but only abnormal results are displayed) Labs  Reviewed - No data to display  EKG  EKG Interpretation None       Radiology Dg Knee Complete 4 Views Left  Result Date: 07/10/2017 CLINICAL DATA:  Acute onset of anterior left knee pain and limited range of motion. EXAM: LEFT KNEE - COMPLETE 4+ VIEW COMPARISON:  Left knee radiographs performed 09/08/2012 FINDINGS: There is no evidence of fracture or dislocation. The joint spaces are preserved. No significant degenerative change is seen; the patellofemoral joint is grossly unremarkable in appearance. An accessory ossicle is noted at the distal patellar tendon. No significant joint effusion is seen. The visualized soft tissues are normal in appearance. IMPRESSION: No evidence of fracture or dislocation. Electronically Signed   By: Roanna RaiderJeffery  Chang M.D.   On: 07/10/2017 06:51    Procedures Procedures (including critical care time)  Medications Ordered in ED Medications  fentaNYL (SUBLIMAZE) injection 100 mcg (100 mcg Intramuscular Given 07/11/17 2230)     Initial Impression / Assessment and Plan / ED Course  I have reviewed the  triage vital signs and the nursing notes.  Pertinent labs & imaging results that were available during my care of the patient were reviewed by me and considered in my medical decision making (see chart for details).     Patient with ongoing worsening pain of the left knee secondary to injury 2 nights ago.  Afebrile, initially hypertensive with improvement while in the ED.  No focal neurological deficits.  Physical examination is concerning for partial quadriceps tendon rupture, patient is already wearing the immobilizer and has follow[facility].  Low suspicion of septic joint in the presence of trauma and absence of constitutional symptoms or risk factors. Furthermore, pain is localized only to the quadriceps tendon. Doubt osteomyelitis, gout, fracture, or dislocation.  He has only been taking 1 Percocet tablet for pain at a time.  Will apply Ace wrap, manage pain in the ED, and he is stable for discharge home with instructions to increase to 2 tablets of Percocet every 6 hours as needed for pain until follow-up [facility].  We will provide Colace for management of possible constipation secondary to narcotic use.  Discussed indications for return to the ED.  Patient and patient's daughter verbalized understanding of and agreement with plan, and patient is stable for discharge home at this time.  Patient seen and evaluated by Dr. Donnald GarrePfeiffer who agrees with assessment and plan at this time.  Final Clinical Impressions(s) / ED Diagnoses   Final diagnoses:  Acute pain of left knee    ED Discharge Orders        Ordered    docusate sodium (COLACE) 250 MG capsule  Daily     07/11/17 2212       Jeanie SewerFawze, Akeisha Lagerquist A, PA-C 07/11/17 2254    Arby BarrettePfeiffer, Marcy, MD 07/13/17 [phone]

## 2017-07-11 NOTE — Discharge Instructions (Signed)
Continue to wear the knee immobilizer and avoid weightbearing on the knee.  Start taking 2 Percocet every 6 hours as needed for pain.  Start taking Colace to avoid constipation due to pain medication use.  Follow-up with your orthopedist as soon as possible for reevaluation of your symptoms.  Return to the ED if any concerning signs or symptoms develop.

## 2017-11-23 ENCOUNTER — Emergency Department (HOSPITAL_COMMUNITY): Payer: 59

## 2017-11-23 ENCOUNTER — Encounter (HOSPITAL_COMMUNITY): Payer: Self-pay | Admitting: Emergency Medicine

## 2017-11-23 ENCOUNTER — Observation Stay (HOSPITAL_COMMUNITY)
Admission: EM | Admit: 2017-11-23 | Discharge: 2017-11-25 | Disposition: A | Discharge status: Home / Self Care | Payer: 59 | Attending: Cardiovascular Disease | Admitting: Cardiovascular Disease

## 2017-11-23 DIAGNOSIS — R072 Precordial pain: Principal | ICD-10-CM | POA: Insufficient documentation

## 2017-11-23 DIAGNOSIS — E785 Hyperlipidemia, unspecified: Secondary | ICD-10-CM | POA: Diagnosis not present

## 2017-11-23 DIAGNOSIS — J45909 Unspecified asthma, uncomplicated: Secondary | ICD-10-CM | POA: Insufficient documentation

## 2017-11-23 DIAGNOSIS — I129 Hypertensive chronic kidney disease with stage 1 through stage 4 chronic kidney disease, or unspecified chronic kidney disease: Secondary | ICD-10-CM | POA: Insufficient documentation

## 2017-11-23 DIAGNOSIS — Z79899 Other long term (current) drug therapy: Secondary | ICD-10-CM | POA: Insufficient documentation

## 2017-11-23 DIAGNOSIS — N183 Chronic kidney disease, stage 3 (moderate): Secondary | ICD-10-CM | POA: Insufficient documentation

## 2017-11-23 DIAGNOSIS — G4733 Obstructive sleep apnea (adult) (pediatric): Secondary | ICD-10-CM | POA: Insufficient documentation

## 2017-11-23 DIAGNOSIS — Z7982 Long term (current) use of aspirin: Secondary | ICD-10-CM | POA: Diagnosis not present

## 2017-11-23 DIAGNOSIS — Z8673 Personal history of transient ischemic attack (TIA), and cerebral infarction without residual deficits: Secondary | ICD-10-CM | POA: Insufficient documentation

## 2017-11-23 DIAGNOSIS — R079 Chest pain, unspecified: Secondary | ICD-10-CM | POA: Diagnosis not present

## 2017-11-23 DIAGNOSIS — Q211 Atrial septal defect: Secondary | ICD-10-CM | POA: Insufficient documentation

## 2017-11-23 LAB — BASIC METABOLIC PANEL
ANION GAP: 10 (ref 5–15)
BUN: 20 mg/dL (ref 6–20)
CALCIUM: 9.2 mg/dL (ref 8.9–10.3)
CO2: 27 mmol/L (ref 22–32)
Chloride: 104 mmol/L (ref 101–111)
Creatinine, Ser: 1.48 mg/dL — ABNORMAL HIGH (ref 0.61–1.24)
GFR, EST AFRICAN AMERICAN: 59 mL/min — AB (ref >60)
GFR, EST NON AFRICAN AMERICAN: 51 mL/min — AB (ref >60)
Glucose, Bld: 115 mg/dL — ABNORMAL HIGH (ref 65–99)
POTASSIUM: 3.8 mmol/L (ref 3.5–5.1)
Sodium: 141 mmol/L (ref 135–145)

## 2017-11-23 LAB — CBC
HEMATOCRIT: 38.8 % — AB (ref 39.0–52.0)
HEMOGLOBIN: 13.2 g/dL (ref 13.0–17.0)
MCH: 31.7 pg (ref 26.0–34.0)
MCHC: 34 g/dL (ref 30.0–36.0)
MCV: 93 fL (ref 78.0–100.0)
Platelets: 210 10*3/uL (ref 150–400)
RBC: 4.17 MIL/uL — AB (ref 4.22–5.81)
RDW: 14 % (ref 11.5–15.5)
WBC: 6.5 10*3/uL (ref 4.0–10.5)

## 2017-11-23 LAB — TROPONIN I

## 2017-11-23 LAB — I-STAT TROPONIN, ED
TROPONIN I, POC: 0 ng/mL (ref 0.00–0.08)
TROPONIN I, POC: 0 ng/mL (ref 0.00–0.08)

## 2017-11-23 MED ORDER — ONDANSETRON HCL 4 MG/2ML IJ SOLN: 4.0000 mg | Freq: Four times a day (QID) | INTRAMUSCULAR | Status: DC | PRN

## 2017-11-23 MED ORDER — HYDROMORPHONE HCL 2 MG/ML IJ SOLN
1.0000 mg | Freq: Once | INTRAMUSCULAR | Status: AC
  Administered 2017-11-23: 1 mg via INTRAVENOUS
  Filled 2017-11-23: qty 1

## 2017-11-23 MED ORDER — MORPHINE SULFATE (PF) 4 MG/ML IV SOLN
4.0000 mg | Freq: Once | INTRAVENOUS | Status: AC
  Administered 2017-11-23: 4 mg via INTRAVENOUS
  Filled 2017-11-23: qty 1

## 2017-11-23 MED ORDER — LOSARTAN POTASSIUM 25 MG PO TABS
25.0000 mg | ORAL_TABLET | Freq: Every day | ORAL | Status: DC
  Administered 2017-11-24 – 2017-11-25 (×2): 25 mg via ORAL
  Filled 2017-11-23 (×2): qty 1

## 2017-11-23 MED ORDER — ASPIRIN EC 81 MG PO TBEC
81.0000 mg | DELAYED_RELEASE_TABLET | Freq: Every day | ORAL | Status: DC
  Administered 2017-11-24 – 2017-11-25 (×2): 81 mg via ORAL
  Filled 2017-11-23 (×2): qty 1

## 2017-11-23 MED ORDER — METOPROLOL TARTRATE 12.5 MG HALF TABLET
12.5000 mg | ORAL_TABLET | Freq: Two times a day (BID) | ORAL | Status: DC
Start: 2017-11-23 — End: 2017-11-25
  Administered 2017-11-23 – 2017-11-24 (×3): 12.5 mg via ORAL
  Filled 2017-11-23 (×4): qty 1

## 2017-11-23 MED ORDER — NITROGLYCERIN 0.4 MG SL SUBL
0.4000 mg | SUBLINGUAL_TABLET | SUBLINGUAL | Status: DC | PRN
  Administered 2017-11-23 (×2): 0.4 mg via SUBLINGUAL
  Filled 2017-11-23: qty 1

## 2017-11-23 MED ORDER — AMLODIPINE BESYLATE 10 MG PO TABS
10.0000 mg | ORAL_TABLET | Freq: Every day | ORAL | Status: DC
Start: 2017-11-24 — End: 2017-11-25
  Administered 2017-11-24 – 2017-11-25 (×2): 10 mg via ORAL
  Filled 2017-11-23 (×2): qty 1

## 2017-11-23 MED ORDER — ACETAMINOPHEN 325 MG PO TABS
650.0000 mg | ORAL_TABLET | ORAL | Status: DC | PRN
  Administered 2017-11-24: 650 mg via ORAL
  Filled 2017-11-23: qty 2

## 2017-11-23 MED ORDER — HEPARIN SODIUM (PORCINE) 5000 UNIT/ML IJ SOLN
5000.0000 [IU] | Freq: Three times a day (TID) | INTRAMUSCULAR | Status: DC
  Filled 2017-11-23: qty 1

## 2017-11-23 NOTE — Progress Notes (Signed)
Pt arrived to unit from ED-assisted to bed. Pt moaning and groaning states he has continued to have 10/10 chest pain. Pt states "nothing they have given me has worked" and then asked "are they going to give me something for this pain". I explained to pt that per report from ED no further narcotics are going to be provided at this time. We will monitor lab work/VS and cardiac monitoring and treat accordingly. Pt voiced understanding and then asked for food. VS stable, Pt placed on O2 2L/[place], oriented to unit and routine. Will conitnue to monitor. Dierdre Highman, RN

## 2017-11-23 NOTE — ED Triage Notes (Signed)
Pt went to work and started having central CP 10/10. Nonradiating. Diaphoretic. EMS gave 324 ASA and 2 nitro. No change in pain. BP 131/72, HR 76, spo2 97%.

## 2017-11-23 NOTE — H&P (Addendum)
Cardiology Admission History and Physical:   Patient ID: [name]; MRN: [id number]; DOB: August 27, 1960   Admission date: 11/23/2017  Primary Care Provider: Devra Dopp, MD Primary Cardiologist: New - Dr. Excell Seltzer Primary Electrophysiologist:  N/A  Chief Complaint:  Chest pain  Patient Profile:   [name] is a 57 y.o. male with a history of with PMH of HTN, HLD, PFO, OSA, CKD stage III, and h/o CVA presented to the ED with 3 hours of chest pain at work  History of Present Illness:   [name] is a 57 yo AA male with a history of with PMH of HTN, HLD, PFO, OSA, CKD stage III, and h/o CVA.  Both of his parents died of MI.  His mother had first MI at age 64, father died of massive MI at age 23.  He never smoked, does not drink and does not do any illicit drug.  He had a history of CVA and underwent TEE on 12/09/2010 that showed normal LV function and positive PFO.  He has been seen multiple times in the emergency room for various issues in 2018.  He had a URI in October 2018 and was treated with albuterol.  He denies any prior cardiac history.  He works as a Furniture conservator/restorer in the denies any recent exertional shortness of breath and chest pain prior to today.  Today around 1 PM while returning from the lunch break walking on the side of the building he started having severe left-sided substernal chest pain.  He described the chest pain as a dull pressure.  He also has been having some shortness of breath, dizziness and diaphoresis.  Chest pain is worse with walking or sitting up.  There is no increase in the chest pain with deep inspiration, body rotational palpation.  For the past 4 days, he has a mild productive cough with green sputum, however denies any fever or chills.  Since 1 PM, he has been having persistent 10 out of 10 chest pain.  First troponin was negative.  EKG did not show any acute changes.  Chest x-ray has been performed and the result is pending.  Cardiology has been  consulted for chest pain.   Past Medical History:  Diagnosis Date  . BPH (benign prostatic hyperplasia)   . CKD (chronic kidney disease), stage III (HCC)    pt states he's unaware  . History of CVA in adulthood    09-20-2010  acute right superior cerebellar infarct/ pt denies deficits  . HTN (hypertension)    no meds x2 mos  . Hyperlipidemia   . Incomplete rotator cuff tear    left shoulder  . Mild intermittent asthma   . OA (osteoarthritis)    AC  joint left shoulder   . PFO (patent foramen ovale)    small patent PFO w/ left to right flow per TEE 12-09-2010  . Renal cyst, right    simple per ultrasound 11-19-2015  . Shoulder impingement, left   . Sleep apnea     Past Surgical History:  Procedure Laterality Date  . ABDOMINAL SURGERY  1984   puncture wound; accident (chopping wood)  . SHOULDER ARTHROSCOPY WITH ROTATOR CUFF REPAIR AND OPEN BICEPS TENODESIS Left 08/22/2016   Procedure: BICEP TENODESIS;  Surgeon: Yolonda Kida, MD;  Location: [facility];  Service: Orthopedics;  Laterality: Left;  . TRANSESOPHAGEAL ECHOCARDIOGRAM  12/09/2010   normal LVF ;  small patent foreman ovale (PFO) by color doppler w/ left to  right flow, with contrast injection there were a few large bubbles in left atrium/  trivial MR     Medications Prior to Admission: Prior to Admission medications   Medication Sig Start Date End Date Taking? Authorizing Provider  albuterol (PROVENTIL HFA) 108 (90 BASE) MCG/ACT inhaler Inhale 2 puffs into the lungs daily as needed.  10/20/13   [provider]  aspirin 81 MG tablet Take 81 mg by mouth daily.    [provider]  Cetirizine HCl (ZYRTEC ALLERGY) 10 MG CAPS Take 1 capsule (10 mg total) by mouth daily. 12/02/15   Ward, Layla Maw, DO  docusate sodium (COLACE) 250 MG capsule Take 1 capsule (250 mg total) by mouth daily. 07/11/17   Fawze, Mina A, PA-C  fluticasone (FLONASE) 50 MCG/ACT nasal spray Place 2 sprays into both  nostrils daily. 12/02/15   Ward, Layla Maw, DO  oxyCODONE-acetaminophen (PERCOCET) 5-325 MG tablet Take 1 tablet by mouth every 4 (four) hours as needed for moderate pain. 07/10/17   Dione Booze, MD  famotidine (PEPCID) 20 MG tablet One at bedtime 09/03/12 05/10/15  Nyoka Cowden, MD  mometasone-formoterol [facility]) 100-5 MCG/ACT AERO Take 2 puffs first thing in am and then another 2 puffs about 12 hours later. 09/03/12 05/10/15  Nyoka Cowden, MD  pantoprazole (PROTONIX) 40 MG tablet Take 1 tablet (40 mg total) by mouth daily. Take 30-60 min before first meal of the day 09/03/12 05/10/15  Nyoka Cowden, MD     Allergies:   No Known Allergies  Social History:   Social History   Socioeconomic History  . Marital status: Single    Spouse name: n/a  . Number of children: 3  . Years of education: associates  . Highest education level: Not on file  Occupational History  . Occupation: Data processing manager: [employer]  Social Needs  . Financial resource strain: Not on file  . Food insecurity:    Worry: Not on file    Inability: Not on file  . Transportation needs:    Medical: Not on file    Non-medical: Not on file  Tobacco Use  . Smoking status: Never Smoker  . Smokeless tobacco: Never Used  Substance and Sexual Activity  . Alcohol use: No  . Drug use: No  . Sexual activity: Not on file  Lifestyle  . Physical activity:    Days per week: Not on file    Minutes per session: Not on file  . Stress: Not on file  Relationships  . Social connections:    Talks on phone: Not on file    Gets together: Not on file    Attends religious service: Not on file    Active member of club or organization: Not on file    Attends meetings of clubs or organizations: Not on file    Relationship status: Not on file  . Intimate partner violence:    Fear of current or ex partner: Not on file    Emotionally abused: Not on file    Physically abused: Not on file    Forced sexual  activity: Not on file  Other Topics Concern  . Not on file  Social History Narrative   His father lives with him.   Adult children live independently, 2 in [place], one in [place]    Family History:   The patient's family history includes Alcohol abuse in his brother; Diabetes in his mother and sister; Heart attack (age of  onset: 26) in his mother; Hypertension in his mother.    ROS:  Please see the history of present illness.  All other ROS reviewed and negative.     Physical Exam/Data:   Vitals:   11/23/17 1445 11/23/17 1515 11/23/17 1531 11/23/17 1545  BP: 129/80 (!) 141/82 133/61 134/74  Pulse: 71 67 65 64  Resp: Temp:      TempSrc:      SpO2: 97% 94% 96% 98%  Weight:      Height:       No intake or output data in the 24 hours ending 11/23/17 1604 Filed Weights   11/23/17 1416  Weight: 230 lb (104.3 kg)   Body mass index is 31.19 kg/m.  General:  Well nourished, well developed, in no acute distress HEENT: normal Lymph: no adenopathy Neck: no JVD Endocrine:  No thryomegaly Vascular: No carotid bruits; FA pulses 2+ bilaterally without bruits  Cardiac:  normal S1, S2; RRR; no murmur  Lungs:  clear to auscultation bilaterally, no wheezing, rhonchi or rales  Abd: soft, nontender, no hepatomegaly  Ext: no edema Musculoskeletal:  No deformities, BUE and BLE strength normal and equal Skin: warm and dry  Neuro:  CNs 2-12 intact, no focal abnormalities noted Psych:  Normal affect    EKG:  The ECG that was done in the ED 11/23/2017 was personally reviewed and demonstrates normal sinus rhythm without significant ST-T wave changes  Relevant CV Studies:  N/A  Laboratory Data:  Chemistry Recent Labs  Lab 11/23/17 1420  NA 141  K 3.8  CL 104  CO2 27  GLUCOSE 115*  BUN 20  CREATININE 1.48*  CALCIUM 9.2  GFRNONAA 51*  GFRAA 59*  ANIONGAP 10    No results for input(s): PROT, ALBUMIN, AST, ALT, ALKPHOS, BILITOT in the last 168  hours. Hematology Recent Labs  Lab 11/23/17 1420  WBC 6.5  RBC 4.17*  HGB 13.2  HCT 38.8*  MCV 93.0  MCH 31.7  MCHC 34.0  RDW 14.0  PLT 210   Cardiac EnzymesNo results for input(s): TROPONINI in the last 168 hours.  Recent Labs  Lab 11/23/17 1451  TROPIPOC 0.00    BNPNo results for input(s): BNP, PROBNP in the last 168 hours.  DDimer No results for input(s): DDIMER in the last 168 hours.  Radiology/Studies:  No results found.  Assessment and Plan:   1. Chest pain: Cardiac risk factors include age, hypertension, hyperlipidemia and family history.  So far patient had a 3-hour onset of persistent chest pain, this is new for him.  EKG showed no acute changes, first point-of-care troponin 2 hours after onset of chest pain was negative.  Will discuss with MD, possible options including coronary CT versus cardiac catheterization.  He denies any recent lower extremity edema, heart rate is stable, O2 saturation is also normal, suspicion for PE relatively low.  Blood pressure is not very high either to correlate with aortic dissection.  Continue to trend troponin, if positive, will need cardiac catheterization.  2. Hypertension: Blood pressure mildly elevated in the 130s, although hypertension is listed as part of his medical history, he is not taking any medication for it.  3. Hyperlipidemia: He is not taking any cholesterol medication either.  Obtain fasting lipid panel tomorrow  4. PFO: Seen on previous transesophageal echocardiogram in 2012  5. Obstructive sleep apnea:  Patient does not use CPAP at home, he says he tried a CPAP in the past but could  not tolerate it  6. CKD stage III: Creatinine close to baseline  7. history of CVA: No recurrence  Severity of Illness: The appropriate patient status for this patient is OBSERVATION. Observation status is judged to be reasonable and necessary in order to provide the required intensity of service to ensure the patient's safety. The  patient's presenting symptoms, physical exam findings, and initial radiographic and laboratory data in the context of their medical condition is felt to place them at decreased risk for further clinical deterioration. Furthermore, it is anticipated that the patient will be medically stable for discharge from the hospital within 2 midnights of admission. The following factors support the patient status of observation.   " The patient's presenting symptoms include chest pain. " The physical exam findings include benign. " The initial radiographic and laboratory data are normal EKG, nonacute EKG.     For questions or updates, please contact [facility] Please consult [email or web address] for contact info under Cardiology/STEMI.   Ramond Dial, [place]  11/23/2017 4:04 PM   Patient seen, examined. Available data reviewed. Agree with findings, assessment, and plan as outlined by Azalee Course, PA-C. On my exam: Vitals:   11/23/17 1630 11/23/17 1645  BP: 120/71 134/78  Pulse: 64 62  Resp: 15 16  Temp:    SpO2: 96% 98%   Pt is alert and oriented, NAD HEENT: normal Neck: JVP - normal, carotids 2+= without bruits Lungs: CTA bilaterally CV: RRR without murmur or gallop Abd: soft, NT, Positive BS, no hepatomegaly Ext: no C/C/E, distal pulses intact and equal Skin: warm/dry no rash  Troponin is negative on initial testing.  EKG is without any acute changes.  There is an isolated T wave inversion in V3 which is clinically insignificant but no significant ST abnormality.  Agree with continuing to cycle this patient's enzymes, check an echocardiogram to evaluate for any wall motion abnormality, LV dysfunction, or pericardial effusion.  I agree that there is a low likelihood of pericarditis, aortic dissection, pulmonary embolus, or other acute non-coronary pathology.  Will check a gated coronary CTA to evaluate for obstructive coronary artery disease as a cause of unstable angina pectoris.  Plan reviewed with  patient and his family who agree.  If his enzymes trend in a positive direction, would start IV heparin and proceed with invasive coronary angiography.  Tonny Bollman, M.D. 11/23/2017 5:39 PM

## 2017-11-23 NOTE — ED Provider Notes (Signed)
[facility] EMERGENCY DEPARTMENT Provider Note   CSN: [id number] Arrival date & time: 11/23/17  1414     History   Chief Complaint Chief Complaint  Patient presents with  . Chest Pain    HPI [name] is a 57 y.o. male.  The history is provided by the patient and medical records. No language interpreter was used.  Chest Pain   This is a new problem. The current episode started 1 to 2 hours ago. The problem occurs constantly. The problem has not changed since onset.The pain is associated with exertion. The pain is present in the lateral region and substernal region. The pain is at a severity of 10/10. The pain is severe. The quality of the pain is described as exertional, heavy, pressure-like and dull. The pain radiates to the left arm and right arm. Duration of episode(s) is 2 hours. The symptoms are aggravated by exertion. Associated symptoms include diaphoresis, exertional chest pressure, nausea and shortness of breath. Pertinent negatives include no abdominal pain, no back pain, no cough, no fever, no headaches, no irregular heartbeat, no leg pain, no lower extremity edema, no malaise/fatigue, no near-syncope and no palpitations. He has tried nothing for the symptoms. The treatment provided no relief. Risk factors include male gender.  His past medical history is significant for strokes and TIA.    Past Medical History:  Diagnosis Date  . BPH (benign prostatic hyperplasia)   . CKD (chronic kidney disease), stage III (HCC)    pt states he's unaware  . History of CVA in adulthood    09-20-2010  acute right superior cerebellar infarct/ pt denies deficits  . HTN (hypertension)    no meds x2 mos  . Hyperlipidemia   . Incomplete rotator cuff tear    left shoulder  . Mild intermittent asthma   . OA (osteoarthritis)    AC  joint left shoulder   . PFO (patent foramen ovale)    small patent PFO w/ left to right flow per TEE 12-09-2010  . Renal cyst, right      simple per ultrasound 11-19-2015  . Shoulder impingement, left   . Sleep apnea     Patient Active Problem List   Diagnosis Date Noted  . SLAP lesion of left shoulder 08/22/2016  . Tendonitis of upper biceps tendon of left shoulder 08/22/2016  . Subacromial impingement of left shoulder 08/22/2016  . Arthritis of left acromioclavicular joint 08/22/2016  . H/O transient cerebral ischemia 07/02/2015  . 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 Benign essential HTN 05/11/2015  . Asthma 09/03/2012  . Stroke [facility])     Past Surgical History:  Procedure Laterality Date  . ABDOMINAL SURGERY  1984   puncture wound; accident (chopping wood)  . SHOULDER ARTHROSCOPY WITH ROTATOR CUFF REPAIR AND OPEN BICEPS TENODESIS Left 08/22/2016   Procedure: BICEP TENODESIS;  Surgeon: Yolonda Kida, MD;  Location: [facility];  Service: Orthopedics;  Laterality: Left;  . TRANSESOPHAGEAL ECHOCARDIOGRAM  12/09/2010   normal LVF ;  small patent foreman ovale (PFO) by color doppler w/ left to right flow, with contrast injection there were a few large bubbles in left atrium/  trivial MR        Home Medications    Prior to Admission medications   Medication Sig Start Date End Date Taking? Authorizing Provider  albuterol (PROVENTIL HFA) 108 (90 BASE) MCG/ACT inhaler Inhale 2 puffs into the lungs daily as needed.  10/20/13   [provider]  aspirin 81 MG tablet Take 81 mg by mouth daily.    [provider]  Cetirizine HCl (ZYRTEC ALLERGY) 10 MG CAPS Take 1 capsule (10 mg total) by mouth daily. 12/02/15   Ward, Layla Maw, DO  docusate sodium (COLACE) 250 MG capsule Take 1 capsule (250 mg total) by mouth daily. 07/11/17   Fawze, Mina A, PA-C  fluticasone (FLONASE) 50 MCG/ACT nasal spray Place 2 sprays into both nostrils daily. 12/02/15   Ward, Layla Maw, DO  oxyCODONE-acetaminophen (PERCOCET) 5-325 MG tablet Take 1 tablet by mouth every 4 (four) hours as needed for moderate  pain. 07/10/17   Dione Booze, MD  famotidine (PEPCID) 20 MG tablet One at bedtime 09/03/12 05/10/15  Nyoka Cowden, MD  mometasone-formoterol [facility]) 100-5 MCG/ACT AERO Take 2 puffs first thing in am and then another 2 puffs about 12 hours later. 09/03/12 05/10/15  Nyoka Cowden, MD  pantoprazole (PROTONIX) 40 MG tablet Take 1 tablet (40 mg total) by mouth daily. Take 30-60 min before first meal of the day 09/03/12 05/10/15  Nyoka Cowden, MD    Family History Family History  Problem Relation Age of Onset  . Diabetes Mother   . Hypertension Mother   . Heart attack Mother 67  . Diabetes Sister   . Alcohol abuse Brother     Social History Social History   Tobacco Use  . Smoking status: Never Smoker  . Smokeless tobacco: Never Used  Substance Use Topics  . Alcohol use: No  . Drug use: No     Allergies   Patient has no known allergies.   Review of Systems Review of Systems  Constitutional: Positive for diaphoresis and fatigue. Negative for chills, fever and malaise/fatigue.  HENT: Negative for congestion.   Eyes: Negative for visual disturbance.  Respiratory: Positive for shortness of breath. Negative for cough, chest tightness, wheezing and stridor.   Cardiovascular: Positive for chest pain. Negative for palpitations and near-syncope.  Gastrointestinal: Positive for nausea. Negative for abdominal pain, constipation and diarrhea.  Genitourinary: Negative for frequency.  Musculoskeletal: Negative for back pain, neck pain and neck stiffness.  Neurological: Negative for light-headedness and headaches.  Psychiatric/Behavioral: Negative for agitation.  All other systems reviewed and are negative.    Physical Exam Updated Vital Signs BP 140/66 (BP Location: Right Arm)   Pulse 79   Temp 98.5 F (36.9 C) (Oral)   Resp 16   Ht 6' (1.829 m)   Wt 104.3 kg (230 lb)   SpO2 97%   BMI 31.19 kg/m   Physical Exam  Constitutional: He appears well-developed and  well-nourished.  Non-toxic appearance. He does not appear ill. No distress.  HENT:  Head: Normocephalic and atraumatic.  Mouth/Throat: Oropharynx is clear and moist.  Eyes: Pupils are equal, round, and reactive to light. Conjunctivae and EOM are normal.  Neck: Normal range of motion. Neck supple.  Cardiovascular: Normal rate and regular rhythm.  No murmur heard. Pulmonary/Chest: Effort normal and breath sounds normal. No respiratory distress. He has no decreased breath sounds. He has no wheezes. He has no rales. He exhibits no tenderness.  Abdominal: Soft. There is no tenderness.  Musculoskeletal: He exhibits no edema or tenderness.  Neurological: He is alert. No sensory deficit. He exhibits normal muscle tone.  Skin: Skin is warm. Capillary refill takes less than 2 seconds. He is diaphoretic. No erythema. No pallor.  Psychiatric: He has a normal mood and affect.  Nursing note and vitals reviewed.    ED  Treatments / Results  Labs (all labs ordered are listed, but only abnormal results are displayed) Labs Reviewed  BASIC METABOLIC PANEL - Abnormal; Notable for the following components:      Result Value   Glucose, Bld 115 (*)    Creatinine, Ser 1.48 (*)    GFR calc non Af Amer 51 (*)    GFR calc Af Amer 59 (*)    All other components within normal limits  CBC - Abnormal; Notable for the following components:   RBC 4.17 (*)    HCT 38.8 (*)    All other components within normal limits  TROPONIN I  TROPONIN I  TROPONIN I  HIV ANTIBODY (ROUTINE TESTING)  CBC  CREATININE, SERUM  BASIC METABOLIC PANEL  LIPID PANEL  I-STAT TROPONIN, ED  I-STAT TROPONIN, ED    EKG EKG Interpretation  Date/Time:  Monday November 23 2017 16:17:45 EDT Ventricular Rate:  68 PR Interval:    QRS Duration: 92 QT Interval:  398 QTC Calculation: 424 R Axis:   10 Text Interpretation:  Sinus rhythm When compared to ECG earlier today, new t wave inversion in lead 3.  No STEMI Confirmed by Theda Belfast ([id number]) on 11/23/2017 4:20:49 PM   Radiology No results found.  Procedures Procedures (including critical care time)  Medications Ordered in ED Medications  morphine 4 MG/ML injection 4 mg (4 mg Intravenous Given 11/23/17 1449)  HYDROmorphone (DILAUDID) injection 1 mg (1 mg Intravenous Given 11/23/17 1550)     Initial Impression / Assessment and Plan / ED Course  I have reviewed the triage vital signs and the nursing notes.  Pertinent labs & imaging results that were available during my care of the patient were reviewed by me and considered in my medical decision making (see chart for details).     [name] is a 57 y.o. male with a past medical history significant for asthma, hypertension, and prior TIA/stroke who presents with severe chest pain.  Patient reports that within the last 2 hours he had onset of crushing pressure-like chest pain.  He reports that in his left central chest and radiates down both arms.  He reports associated diaphoresis, shortness of breath, lightheadedness, and nausea.  He denies any emesis.  He denies any syncope.  He denies any recent trauma.  He has not taken any medicine aside from the aspirin and nitroglycerin with EMS which did not help his symptoms.  He reports it is exertional but is not pleuritic.  He denies any history of DVT or PE.  He reports a family history of heart disease but is unsure if it is early heart disease or not.  He does not smoke.  He reports his pain is 10 out of 10 in severity and is "somebody standing on my chest".  On EKG, no evidence of STEMI seen.  On exam, chest is nontender.  Discomfort was not reproducible.  Lungs were clear.  Patient has symmetric pulses in upper and lower extremity's.  No significant lower extremity edema or tenderness.  Abdomen was nontender.  No murmurs appreciated.  Heart score calculated as a 5.  Patient given pain medicine to try and alleviate his symptoms.  It was unsuccessful.  Patient  had initial troponin negative however delta troponin will be ordered.  Given the patient's high risk chest pain with the concerning story and his elevated heart score, cardiology will be called to help determine disposition.  Anticipate following up on the recommendations.  Suspect patient  will need to be admitted for his symptoms and for ruling out cardiac chest pain.      4:20 PM Repeat EKG was performed.  Patient has a new T wave inversion compared to 1 when he arrived.  This is concerning for dynamic T wave changes.    Anticipate following up on cardiology recommendations.      Final Clinical Impressions(s) / ED Diagnoses   Final diagnoses:  Precordial pain     Clinical Impression: 1. Precordial pain     Disposition: Awaiting cardiology recommendations to determine disposition.      Harleigh Civello, Canary Brim, MD 11/23/17 [phone]

## 2017-11-23 NOTE — ED Notes (Signed)
Patient transported to X-ray 

## 2017-11-23 NOTE — ED Notes (Signed)
Cardiology at bedside.

## 2017-11-24 ENCOUNTER — Other Ambulatory Visit: Payer: Self-pay

## 2017-11-24 ENCOUNTER — Observation Stay (HOSPITAL_COMMUNITY): Payer: 59

## 2017-11-24 DIAGNOSIS — R0789 Other chest pain: Secondary | ICD-10-CM | POA: Diagnosis not present

## 2017-11-24 DIAGNOSIS — R072 Precordial pain: Secondary | ICD-10-CM

## 2017-11-24 LAB — BASIC METABOLIC PANEL
ANION GAP: 9 (ref 5–15)
BUN: 18 mg/dL (ref 6–20)
CALCIUM: 9.1 mg/dL (ref 8.9–10.3)
CO2: 27 mmol/L (ref 22–32)
Chloride: 103 mmol/L (ref 101–111)
Creatinine, Ser: 1.35 mg/dL — ABNORMAL HIGH (ref 0.61–1.24)
GFR calc Af Amer: >60 mL/min (ref >60)
GFR, EST NON AFRICAN AMERICAN: 57 mL/min — AB (ref >60)
GLUCOSE: 96 mg/dL (ref 65–99)
POTASSIUM: 3.6 mmol/L (ref 3.5–5.1)
Sodium: 139 mmol/L (ref 135–145)

## 2017-11-24 LAB — LIPID PANEL
Cholesterol: 124 mg/dL (ref 0–200)
HDL: 24 mg/dL — AB (ref >40)
LDL CALC: 76 mg/dL (ref 0–99)
TRIGLYCERIDES: 120 mg/dL (ref <150)
Total CHOL/HDL Ratio: 5.2 RATIO
VLDL: 24 mg/dL (ref 0–40)

## 2017-11-24 LAB — TROPONIN I: Troponin I: <0.03 ng/mL (ref <0.03)

## 2017-11-24 LAB — MRSA PCR SCREENING: MRSA BY PCR: NEGATIVE

## 2017-11-24 MED ORDER — NITROGLYCERIN 0.4 MG SL SUBL
SUBLINGUAL_TABLET | SUBLINGUAL | Status: AC
  Filled 2017-11-24: qty 1

## 2017-11-24 MED ORDER — NITROGLYCERIN 0.4 MG SL SUBL
0.8000 mg | SUBLINGUAL_TABLET | Freq: Once | SUBLINGUAL | Status: AC
  Administered 2017-11-24: 0.8 mg via SUBLINGUAL

## 2017-11-24 MED ORDER — NITROGLYCERIN 2 % TD OINT
1.0000 [in_us] | TOPICAL_OINTMENT | Freq: Four times a day (QID) | TRANSDERMAL | Status: DC
  Administered 2017-11-24 – 2017-11-25 (×2): 1 [in_us] via TOPICAL
  Filled 2017-11-24: qty 30

## 2017-11-24 MED ORDER — CYCLOBENZAPRINE HCL 10 MG PO TABS
5.0000 mg | ORAL_TABLET | Freq: Three times a day (TID) | ORAL | Status: DC
  Administered 2017-11-24 – 2017-11-25 (×4): 5 mg via ORAL
  Filled 2017-11-24 (×4): qty 1

## 2017-11-24 MED ORDER — IOPAMIDOL (ISOVUE-370) INJECTION 76%
INTRAVENOUS | Status: AC
  Filled 2017-11-24: qty 100

## 2017-11-24 MED ORDER — IOPAMIDOL (ISOVUE-370) INJECTION 76%
100.0000 mL | Freq: Once | INTRAVENOUS | Status: AC
  Administered 2017-11-24: 80 mL via INTRAVENOUS

## 2017-11-24 MED ORDER — OXYCODONE-ACETAMINOPHEN 5-325 MG PO TABS
1.0000 | ORAL_TABLET | Freq: Four times a day (QID) | ORAL | Status: DC | PRN
  Administered 2017-11-24 (×2): 2 via ORAL
  Administered 2017-11-25: 1 via ORAL
  Filled 2017-11-24 (×2): qty 2
  Filled 2017-11-24: qty 1

## 2017-11-24 NOTE — Discharge Summary (Signed)
Discharge Summary    Patient ID: [name],  MRN: [id number], DOB/AGE: Jul 05, 1961 57 y.o.  Admit date: 11/23/2017 Discharge date: 11/25/2017  Primary Care Provider: Devra Dopp Primary Cardiologist: Dr. Excell Seltzer ([place])  Discharge Diagnoses    Active Problems:   Chest pain  Allergies Allergies  Allergen Reactions  . Allopurinol Other (See Comments)    Affected kidneys negatively  . Eggs Or Egg-Derived Products Nausea And Vomiting  . Other Other (See Comments)    No red meat or pork!!    Diagnostic Studies/Procedures    TTE: 11/25/17  Study Conclusions  - Left ventricle: Wall thickness was increased in a pattern of mild   LVH. Systolic function was normal. The estimated ejection   fraction was in the range of 55% to 60%. Left ventricular   diastolic function parameters were normal. - Aortic valve: Valve area (VTI): 3.29 cm^2. Valve area (Vmax):   3.35 cm^2. Valve area (Vmean): 3.45 cm^2. - Left atrium: The atrium was mildly dilated. - Atrial septum: A patent foramen ovale cannot be excluded.  _____________   History of Present Illness     57 yo AA male with a history of with PMH of HTN, HLD, PFO, OSA, CKD stage III, and h/o CVA.  Both of his parents died of MI.  His mother had first MI at age 60, father died of massive MI at age 37.  He never smoked, does not drink and does not do any illicit drug.  He had a history of CVA and underwent TEE on 12/09/2010 that showed normal LV function and positive PFO.  He has been seen multiple times in the emergency room for various issues in 2018.  He had a URI in October 2018 and was treated with albuterol.  He denied any prior cardiac history.  He works currently as a Furniture conservator/restorer in the denied any recent exertional shortness of breath and chest pain prior to today. The day of admission around 1 PM while returning from the lunch break walking on the side of the building he started having severe left-sided substernal  chest pain.  He described the chest pain as a dull pressure.  He also had been having some shortness of breath, dizziness and diaphoresis.  Chest pain was worse with walking or sitting up.  There was no increase in the chest pain with deep inspiration, body rotation or palpation.  For the past 4 days, he has a mild productive cough with green sputum, however denied any fever or chills.  Since 1 PM on the day of admission until time of exam, he reported having persistent 10 out of 10 chest pain.  First troponin was negative.  EKG did not show any acute changes.  Chest x-ray was negative.    Hospital Course     He was admitted with plans to cycle troponins and undergo coronary CT if troponins remained negative. Trop neg x3 and underwent coronary CT the following day that showed mild CAD with ca++ scire of 0. LDL noted at 76, not on home statin. Follow up echo reviewed by Dr. Excell Seltzer noted normal LV function. It was felt that he had costochondritis and recommended that he use a short course of nonsteroidal anti-inflammatory meds. Per Dr. Excell Seltzer ok to give short course of oxycodone/Acetaminophen 5/325 #20 for breakthrough pain. [place] data base was reviewed and patient has had 4 Rx for narcotics in the past 2 years, last was in Dec 2018. He was given a  note to remain out of work for the remainder of the week.   Lynett Fish was seen by Dr. Excell Seltzer and determined stable for discharge home. Follow up in the office has been arranged. Medications are listed below.   _____________  Discharge Vitals Blood pressure 133/72, pulse 67, temperature 98.5 F (36.9 C), temperature source Oral, resp. rate 14, height 6' (1.829 m), weight 229 lb 0.9 oz (103.9 kg), SpO2 98 %.  Filed Weights   11/23/17 2139 11/24/17 0555 11/25/17 0529  Weight: 226 lb 13.7 oz (102.9 kg) 226 lb 6.6 oz (102.7 kg) 229 lb 0.9 oz (103.9 kg)    Labs & Radiologic Studies    CBC Recent Labs    11/23/17 1420  WBC 6.5  HGB 13.2  HCT 38.8*    MCV 93.0  PLT 210   Basic Metabolic Panel Recent Labs    47/82/95 1420 11/24/17 0317  NA 141 139  K 3.8 3.6  CL 104 103  CO2 27 27  GLUCOSE 115* 96  BUN 20 18  CREATININE 1.48* 1.35*  CALCIUM 9.2 9.1   Liver Function Tests No results for input(s): AST, ALT, ALKPHOS, BILITOT, PROT, ALBUMIN in the last 72 hours. No results for input(s): LIPASE, AMYLASE in the last 72 hours. Cardiac Enzymes Recent Labs    11/23/17 2135 11/24/17 0317  TROPONINI <0.03 <0.03   BNP Invalid input(s): POCBNP D-Dimer No results for input(s): DDIMER in the last 72 hours. Hemoglobin A1C No results for input(s): HGBA1C in the last 72 hours. Fasting Lipid Panel Recent Labs    11/24/17 0317  CHOL 124  HDL 24*  LDLCALC 76  TRIG 621  CHOLHDL 5.2   Thyroid Function Tests No results for input(s): TSH, T4TOTAL, T3FREE, THYROIDAB in the last 72 hours.  Invalid input(s): FREET3 _____________  Dg Chest 2 View  Result Date: 11/23/2017 CLINICAL DATA:  Chest pain EXAM: CHEST - 2 VIEW COMPARISON:  05/20/2017 FINDINGS: Bilateral lower lobe scarring. No focal consolidation. No pleural effusion or pneumothorax. The heart is top-normal in size. Visualized osseous structures are within normal limits. IMPRESSION: No evidence of acute cardiopulmonary disease. Electronically Signed   By: Charline Bills M.D.   On: 11/23/2017 17:58   Ct Coronary Morph W/cta Cor W/score W/ca W/cm &/or Wo/cm  Addendum Date: 11/24/2017   ADDENDUM REPORT: 11/24/2017 14:24 CLINICAL DATA:  57 year old male with atypical chest pain. EXAM: Cardiac/Coronary  CT TECHNIQUE: The patient was scanned on a [employer]. FINDINGS: A 120 kV prospective scan was triggered in the descending thoracic aorta at 111 HU's. Axial non-contrast 3 mm slices were carried out through the heart. The data set was analyzed on a dedicated work station and scored using the Agatson method. Gantry rotation speed was 250 msecs and collimation was .6 mm.  No beta blockade and 0.8 mg of sl NTG was given. The 3D data set was reconstructed in 5% intervals of the 67-82 % of the R-R cycle. Diastolic phases were analyzed on a dedicated work station using MPR, MIP and VRT modes. The patient received 80 cc of contrast. Aorta:  Normal size.  No calcifications.  No dissection. Aortic Valve:  Trileaflet.  No calcifications. Coronary Arteries: Separate origins of LAD and LCX arteries directly from the left coronary sinus, RCA has normal origin from the right coronary sinus. Right dominance. RCA is a large dominant artery that gives rise to PDA and PLVB. There is minimal non-calcified plaque. There is no left main left main but separate  origins of LAD and LCX arteries directly from the left coronary sinus. LAD is a large vessel that gives rose to two small diagonal arteries and wraps around the apex. Proximal LAD has mild non-calcified plaque with associated stenosis 25-50%. LCX is a non-dominant artery that gives rise to one large OM1 branch. There is no plaque. Other findings: Normal pulmonary vein drainage into the left atrium. Normal let atrial appendage without a thrombus. Normal size of the pulmonary artery. IMPRESSION: 1. Coronary calcium score of 0. This was 0 percentile for age and sex matched control. 2. Normal coronary origin with right dominance. 3. Mild CAD in the proximal LAD, aggressive risk factor modification is recommended. Electronically Signed   By: Tobias Alexander   On: 11/24/2017 14:24   Result Date: 11/24/2017 EXAM: OVER-READ INTERPRETATION  CT CHEST The following report is an over-read performed by radiologist Dr. Genevive Bi of [facility] on 11/24/2017. This over-read does not include interpretation of cardiac or coronary anatomy or pathology. The coronary CTA interpretation by the cardiologist is attached. COMPARISON:  None. FINDINGS: Limited view of the lung parenchyma demonstrates mild RIGHT basilar atelectasis. Airways are normal.  Limited view of the mediastinum demonstrates no adenopathy. Esophagus normal. Limited view of the upper abdomen unremarkable. Limited view of the skeleton and chest wall is unremarkable. IMPRESSION: Mild RIGHT basilar atelectasis. Electronically Signed: By: Genevive Bi M.D. On: 11/24/2017 13:37   Disposition   Pt is being discharged home today in good condition.  Follow-up Plans & Appointments    Follow-up Information    Devra Dopp, MD Follow up.   Specialty:  Family Medicine Why:  Please arrange to see your PCP as needed.  Contact information: [address] [place] [address] [phone]          Discharge Instructions    Diet - low sodium heart healthy   Complete by:  As directed    Increase activity slowly   Complete by:  As directed       Discharge Medications     Medication List    STOP taking these medications   Cetirizine HCl 10 MG Caps Commonly known as:  ZYRTEC ALLERGY   docusate sodium 250 MG capsule Commonly known as:  COLACE   fluticasone 50 MCG/ACT nasal spray Commonly known as:  FLONASE     TAKE these medications   amLODipine 10 MG tablet Commonly known as:  NORVASC Take 10 mg by mouth daily.   COZAAR 25 MG tablet Generic drug:  losartan Take 25 mg by mouth daily.   oxyCODONE-acetaminophen 5-325 MG tablet Commonly known as:  PERCOCET/ROXICET Take 1-2 tablets by mouth every 6 (six) hours as needed for up to 5 days for severe pain. What changed:    how much to take  when to take this  reasons to take this   PROVENTIL HFA 108 (90 Base) MCG/ACT inhaler Generic drug:  albuterol Inhale 2 puffs into the lungs every 6 (six) hours as needed for wheezing or shortness of breath.       Outstanding Labs/Studies   N/a   Duration of Discharge Encounter   Greater than 30 minutes including physician time.  Signed, Laverda Page NP-C 11/25/2017, 3:17 PM

## 2017-11-24 NOTE — Progress Notes (Signed)
I came by his room this afternoon to see him to discuss his coronary CTA. His family has been demanding of the nursing staff today. Coronary CTA with mild CAD, nothing to explain his chest pain. Troponin is negative. EKG shows no ischemic changes. He was sleeping when I entered the room and resting comfortably. His family stated that he was still having severe chest pain even though he was sleeping. When I woke him, he appeared comfortable but then quickly begam wincing and grabbing his chest. He denies having had any worsening of his pain with meals. Per nursing, he ate a big lunch today and had no problems. He denies abdominal pain and has no point tenderness on exam of his chest wall or abdomen. He has been passing gas. He has no dyspnea, dizziness. Denies any recent heavy lifting or chest wall injury. He does not smoke, drink alcohol or use illicit drugs.   I do not think this represents acute gastritis or a gastric ulcer but we could consider a GI consult tomorrow. I will try starting Flexeril today and percocet. If this helps, he could be discharged with a very short course of these medications.   Verne Carrow 11/24/2017 4:08 PM

## 2017-11-24 NOTE — Progress Notes (Addendum)
Progress Note  Patient Name: [name] Date of Encounter: 11/24/2017  Primary Cardiologist: No primary care provider on file.  Subjective   Still complaining of chest pain, worse with movement.   Inpatient Medications    Scheduled Meds: . amLODipine  10 mg Oral Daily  . aspirin EC  81 mg Oral Daily  . heparin  5,000 Units Subcutaneous Q8H  . losartan  25 mg Oral Daily  . metoprolol tartrate  12.5 mg Oral BID  . nitroGLYCERIN  1 inch Topical Q6H   Continuous Infusions:  PRN Meds: acetaminophen, nitroGLYCERIN, ondansetron (ZOFRAN) IV   Vital Signs    Vitals:   11/23/17 2000 11/23/17 2139 11/24/17 0528 11/24/17 0555  BP: 139/76   127/82  Pulse: 64 67 65 60  Resp: Temp:  97.8 F (36.6 C) 98.1 F (36.7 C) 99 F (37.2 C)  TempSrc:  Oral Oral Oral  SpO2: 94% 100% 97% 100%  Weight:  226 lb 13.7 oz (102.9 kg)  226 lb 6.6 oz (102.7 kg)  Height:        Intake/Output Summary (Last 24 hours) at 11/24/2017 0912 Last data filed at 11/24/2017 0900 Gross per 24 hour  Intake 0 ml  Output 450 ml  Net -450 ml   Filed Weights   11/23/17 1416 11/23/17 2139 11/24/17 0555  Weight: 230 lb (104.3 kg) 226 lb 13.7 oz (102.9 kg) 226 lb 6.6 oz (102.7 kg)    Telemetry    SB - Personally Reviewed  ECG    SB - Personally Reviewed  Physical Exam   General: Well developed, well nourished, male appearing in no acute distress. Head: Normocephalic, atraumatic.  Neck: Supple without bruits, JVD. Lungs:  Resp regular and unlabored, CTA. Heart: RRR, S1, S2, no S3, S4, or murmur; no rub. Abdomen: Soft, non-tender, non-distended with normoactive bowel sounds. No hepatomegaly. No rebound/guarding. No obvious abdominal masses. Extremities: No clubbing, cyanosis, edema. Distal pedal pulses are 2+ bilaterally. Neuro: Alert and oriented X 3. Moves all extremities spontaneously. Psych: Normal affect.  Labs    Chemistry Recent Labs  Lab 11/23/17 1420 11/24/17 0317    NA 141 139  K 3.8 3.6  CL 104 103  CO2 27 27  GLUCOSE 115* 96  BUN 20 18  CREATININE 1.48* 1.35*  CALCIUM 9.2 9.1  GFRNONAA 51* 57*  GFRAA 59* >60  ANIONGAP 10 9     Hematology Recent Labs  Lab 11/23/17 1420  WBC 6.5  RBC 4.17*  HGB 13.2  HCT 38.8*  MCV 93.0  MCH 31.7  MCHC 34.0  RDW 14.0  PLT 210    Cardiac Enzymes Recent Labs  Lab 11/23/17 2135 11/24/17 0317  TROPONINI <0.03 <0.03    Recent Labs  Lab 11/23/17 1451 11/23/17 1851  TROPIPOC 0.00 0.00     BNPNo results for input(s): BNP, PROBNP in the last 168 hours.   DDimer No results for input(s): DDIMER in the last 168 hours.    Radiology    Dg Chest 2 View  Result Date: 11/23/2017 CLINICAL DATA:  Chest pain EXAM: CHEST - 2 VIEW COMPARISON:  05/20/2017 FINDINGS: Bilateral lower lobe scarring. No focal consolidation. No pleural effusion or pneumothorax. The heart is top-normal in size. Visualized osseous structures are within normal limits. IMPRESSION: No evidence of acute cardiopulmonary disease. Electronically Signed   By: Charline Bills M.D.   On: 11/23/2017 17:58    Cardiac Studies     Patient Profile  57 y.o. male with a history of with PMH of HTN, HLD, PFO, OSA, CKD stage III, and h/o CVA who presented with chest pain.   Assessment & Plan    1. Chest Pain: Continues to c/o chest pain, soreness worse with movement. Trop neg x3. EKG this morning showing SR with no acute ST/T wave changes. Planned for coronary CT today. Cr stable. Added nitro past this morning.   2. HTN: Controlled with current agents.  3. HL: LDL 76, not on statin. Will follow up with CT to determine whether would benefit from statin therapy.   4. PFO: seen on echo in 2012.  5. CKD stage III: Cr stable this morning, Cr 1.35.   6. Hx of CVA: no deficits.   Signed, Laverda Page, NP  11/24/2017, 9:12 AM  Pager # [phone]   For questions or updates, please contact [facility] Please consult [email or web address]  for contact info under Cardiology/STEMI.  I have personally seen and examined this patient. I agree with the assessment and plan as outlined above.  Pt wincing in pain this am. Tele without changes. Cardiac enzymes negative.  My exam this am is overall normal. CV:RRR no murmurs. Pulm: clear bilaterally. NO Lower ext edema.  Labs reviewed. Vitals reviewed.  Plans in place for cardiac CTA today to exclude obstructive CAD. If he has evidence of obstructive CAD on scan, will need a cardiac cath.   Verne Carrow 11/24/2017 11:05 AM

## 2017-11-25 ENCOUNTER — Observation Stay (HOSPITAL_BASED_OUTPATIENT_CLINIC_OR_DEPARTMENT_OTHER): Payer: 59

## 2017-11-25 DIAGNOSIS — R079 Chest pain, unspecified: Secondary | ICD-10-CM

## 2017-11-25 DIAGNOSIS — R072 Precordial pain: Secondary | ICD-10-CM | POA: Diagnosis not present

## 2017-11-25 LAB — ECHOCARDIOGRAM COMPLETE
HEIGHTINCHES: 72 in
Weight: 3664.93 oz

## 2017-11-25 LAB — HIV ANTIBODY (ROUTINE TESTING W REFLEX): HIV SCREEN 4TH GENERATION: NONREACTIVE

## 2017-11-25 MED ORDER — OXYCODONE-ACETAMINOPHEN 5-325 MG PO TABS: 1.0000 | ORAL_TABLET | Freq: Four times a day (QID) | ORAL | 0 refills | Status: AC | PRN

## 2017-11-25 MED ORDER — IBUPROFEN 600 MG PO TABS
600.0000 mg | ORAL_TABLET | Freq: Three times a day (TID) | ORAL | Status: DC
  Administered 2017-11-25: 600 mg via ORAL
  Filled 2017-11-25: qty 1

## 2017-11-25 MED ORDER — KETOROLAC TROMETHAMINE 30 MG/ML IJ SOLN
30.0000 mg | Freq: Once | INTRAMUSCULAR | Status: AC
  Administered 2017-11-25: 30 mg via INTRAVENOUS
  Filled 2017-11-25: qty 1

## 2017-11-25 NOTE — Progress Notes (Signed)
  Echocardiogram 2D Echocardiogram has been performed.  [name] 11/25/2017, 11:25 AM

## 2017-11-25 NOTE — Progress Notes (Signed)
Discharge instruction was given to pt.  Work letter was given to pt.  Hinton Dyer, RN

## 2017-11-25 NOTE — Progress Notes (Signed)
Progress Note  Patient Name: [name] Date of Encounter: 11/25/2017  Primary Cardiologist: No primary care provider on file.   Subjective   Still having chest pain with movement and deep breathing.  No shortness of breath.  No nausea or vomiting.  No other complaints.  Very uncomfortable with changing positions.  Inpatient Medications    Scheduled Meds: . amLODipine  10 mg Oral Daily  . aspirin EC  81 mg Oral Daily  . cyclobenzaprine  5 mg Oral TID  . heparin  5,000 Units Subcutaneous Q8H  . losartan  25 mg Oral Daily  . metoprolol tartrate  12.5 mg Oral BID  . nitroGLYCERIN  1 inch Topical Q6H   Continuous Infusions:  PRN Meds: acetaminophen, nitroGLYCERIN, ondansetron (ZOFRAN) IV, oxyCODONE-acetaminophen   Vital Signs    Vitals:   11/25/17 0529 11/25/17 0814 11/25/17 0815 11/25/17 1320  BP: 118/61 120/64  133/72  Pulse: (!) 52  (!) 54 67  Resp: 18   14  Temp: 98.3 F (36.8 C)   98.5 F (36.9 C)  TempSrc: Oral   Oral  SpO2: 100%   98%  Weight: 229 lb 0.9 oz (103.9 kg)     Height:        Intake/Output Summary (Last 24 hours) at 11/25/2017 1339 Last data filed at 11/25/2017 0850 Gross per 24 hour  Intake 1320 ml  Output 520 ml  Net 800 ml   Filed Weights   11/23/17 2139 11/24/17 0555 11/25/17 0529  Weight: 226 lb 13.7 oz (102.9 kg) 226 lb 6.6 oz (102.7 kg) 229 lb 0.9 oz (103.9 kg)    Telemetry    NSR, no arrhythmia - Personally Reviewed   Physical Exam  Alert, oriented male in NAD. Very uncomfortable with movement, sitting upright. GEN: No acute distress.   Neck: No JVD Cardiac: RRR, no murmurs, rubs, or gallops.  Respiratory: Clear to auscultation bilaterally. Chest: tender to palpation GI: Soft, nontender, non-distended  MS: No edema; No deformity. Neuro:  Nonfocal  Psych: Normal affect   Labs    Chemistry Recent Labs  Lab 11/23/17 1420 11/24/17 0317  NA 141 139  K 3.8 3.6  CL 104 103  CO2 27 27  GLUCOSE 115* 96  BUN 20 18    CREATININE 1.48* 1.35*  CALCIUM 9.2 9.1  GFRNONAA 51* 57*  GFRAA 59* >60  ANIONGAP 10 9     Hematology Recent Labs  Lab 11/23/17 1420  WBC 6.5  RBC 4.17*  HGB 13.2  HCT 38.8*  MCV 93.0  MCH 31.7  MCHC 34.0  RDW 14.0  PLT 210    Cardiac Enzymes Recent Labs  Lab 11/23/17 2135 11/24/17 0317  TROPONINI <0.03 <0.03    Recent Labs  Lab 11/23/17 1451 11/23/17 1851  TROPIPOC 0.00 0.00     BNPNo results for input(s): BNP, PROBNP in the last 168 hours.   DDimer No results for input(s): DDIMER in the last 168 hours.   Radiology    Dg Chest 2 View  Result Date: 11/23/2017 CLINICAL DATA:  Chest pain EXAM: CHEST - 2 VIEW COMPARISON:  05/20/2017 FINDINGS: Bilateral lower lobe scarring. No focal consolidation. No pleural effusion or pneumothorax. The heart is top-normal in size. Visualized osseous structures are within normal limits. IMPRESSION: No evidence of acute cardiopulmonary disease. Electronically Signed   By: Charline Bills M.D.   On: 11/23/2017 17:58   Ct Coronary Morph W/cta Cor W/score W/ca W/cm &/or Wo/cm  Addendum Date: 11/24/2017  ADDENDUM REPORT: 11/24/2017 14:24 CLINICAL DATA:  57 year old male with atypical chest pain. EXAM: Cardiac/Coronary  CT TECHNIQUE: The patient was scanned on a [employer]. FINDINGS: A 120 kV prospective scan was triggered in the descending thoracic aorta at 111 HU's. Axial non-contrast 3 mm slices were carried out through the heart. The data set was analyzed on a dedicated work station and scored using the Agatson method. Gantry rotation speed was 250 msecs and collimation was .6 mm. No beta blockade and 0.8 mg of sl NTG was given. The 3D data set was reconstructed in 5% intervals of the 67-82 % of the R-R cycle. Diastolic phases were analyzed on a dedicated work station using MPR, MIP and VRT modes. The patient received 80 cc of contrast. Aorta:  Normal size.  No calcifications.  No dissection. Aortic Valve:  Trileaflet.  No  calcifications. Coronary Arteries: Separate origins of LAD and LCX arteries directly from the left coronary sinus, RCA has normal origin from the right coronary sinus. Right dominance. RCA is a large dominant artery that gives rise to PDA and PLVB. There is minimal non-calcified plaque. There is no left main left main but separate origins of LAD and LCX arteries directly from the left coronary sinus. LAD is a large vessel that gives rose to two small diagonal arteries and wraps around the apex. Proximal LAD has mild non-calcified plaque with associated stenosis 25-50%. LCX is a non-dominant artery that gives rise to one large OM1 branch. There is no plaque. Other findings: Normal pulmonary vein drainage into the left atrium. Normal let atrial appendage without a thrombus. Normal size of the pulmonary artery. IMPRESSION: 1. Coronary calcium score of 0. This was 0 percentile for age and sex matched control. 2. Normal coronary origin with right dominance. 3. Mild CAD in the proximal LAD, aggressive risk factor modification is recommended. Electronically Signed   By: Tobias Alexander   On: 11/24/2017 14:24   Result Date: 11/24/2017 EXAM: OVER-READ INTERPRETATION  CT CHEST The following report is an over-read performed by radiologist Dr. Genevive Bi of [facility] on 11/24/2017. This over-read does not include interpretation of cardiac or coronary anatomy or pathology. The coronary CTA interpretation by the cardiologist is attached. COMPARISON:  None. FINDINGS: Limited view of the lung parenchyma demonstrates mild RIGHT basilar atelectasis. Airways are normal. Limited view of the mediastinum demonstrates no adenopathy. Esophagus normal. Limited view of the upper abdomen unremarkable. Limited view of the skeleton and chest wall is unremarkable. IMPRESSION: Mild RIGHT basilar atelectasis. Electronically Signed: By: Genevive Bi M.D. On: 11/24/2017 13:37    Cardiac Studies   2D echo is pending.   The study has been done it just has not been officially interpreted.  I personally reviewed the echo images which demonstrate normal LV systolic function, no significant valvular disease, and no pericardial effusion.  Right heart function also appears normal.  Patient Profile     57 y.o. male with chest pain at rest, exacerbated by movement  Assessment & Plan    Chest pain at rest: Coronary CTA is reviewed and there is no obstructive CAD present.  The patient has minimal plaquing in the LAD and a calcium score of 0.  His LV function is normal by echo.  There is no pericardial effusion.  I highly suspect noncardiac chest pain.  His EKG and troponin levels are all normal.  His clinical scenario is most consistent with costochondritis and I would recommend a course of nonsteroidal anti-inflammatory medicine.  He should follow-up with his primary care physician.  Would be reasonable to give him a short course of oxycodone/acetaminophen 5/325 mg #20 with no refills for breakthrough pain. I have recommended that he remain out of work for the remainder of this week and hopefully his chest pain syndrome will be improving next week so that he can return to work.   For questions or updates, please contact [facility] Please consult [email or web address] for contact info under Cardiology/STEMI.      Signed, Tonny Bollman, MD  11/25/2017, 1:39 PM

## 2018-03-01 ENCOUNTER — Encounter (HOSPITAL_BASED_OUTPATIENT_CLINIC_OR_DEPARTMENT_OTHER): Payer: Self-pay | Admitting: Adult Health

## 2018-03-01 ENCOUNTER — Other Ambulatory Visit: Payer: Self-pay

## 2018-03-01 ENCOUNTER — Emergency Department (HOSPITAL_BASED_OUTPATIENT_CLINIC_OR_DEPARTMENT_OTHER)
Admission: EM | Admit: 2018-03-01 | Discharge: 2018-03-01 | Disposition: A | Discharge status: Home / Self Care | Payer: 59 | Attending: Emergency Medicine | Admitting: Emergency Medicine

## 2018-03-01 DIAGNOSIS — Z79899 Other long term (current) drug therapy: Secondary | ICD-10-CM | POA: Insufficient documentation

## 2018-03-01 DIAGNOSIS — I129 Hypertensive chronic kidney disease with stage 1 through stage 4 chronic kidney disease, or unspecified chronic kidney disease: Secondary | ICD-10-CM | POA: Insufficient documentation

## 2018-03-01 DIAGNOSIS — Z8673 Personal history of transient ischemic attack (TIA), and cerebral infarction without residual deficits: Secondary | ICD-10-CM | POA: Insufficient documentation

## 2018-03-01 DIAGNOSIS — L03312 Cellulitis of back [any part except buttock]: Secondary | ICD-10-CM | POA: Diagnosis not present

## 2018-03-01 DIAGNOSIS — J45909 Unspecified asthma, uncomplicated: Secondary | ICD-10-CM | POA: Insufficient documentation

## 2018-03-01 DIAGNOSIS — N183 Chronic kidney disease, stage 3 (moderate): Secondary | ICD-10-CM | POA: Diagnosis not present

## 2018-03-01 DIAGNOSIS — R222 Localized swelling, mass and lump, trunk: Secondary | ICD-10-CM | POA: Diagnosis present

## 2018-03-01 MED ORDER — CEPHALEXIN 500 MG PO CAPS: 500.0000 mg | ORAL_CAPSULE | Freq: Four times a day (QID) | ORAL | 0 refills | Status: DC

## 2018-03-01 MED ORDER — CEPHALEXIN 250 MG PO CAPS
500.0000 mg | ORAL_CAPSULE | Freq: Once | ORAL | Status: AC
  Administered 2018-03-01: 500 mg via ORAL
  Filled 2018-03-01: qty 2

## 2018-03-01 MED ORDER — NAPROXEN 500 MG PO TABS: 500.0000 mg | ORAL_TABLET | Freq: Two times a day (BID) | ORAL | 0 refills | Status: AC

## 2018-03-01 MED ORDER — HYDROCODONE-ACETAMINOPHEN 5-325 MG PO TABS
1.0000 | ORAL_TABLET | Freq: Once | ORAL | Status: AC
  Administered 2018-03-01: 1 via ORAL
  Filled 2018-03-01: qty 1

## 2018-03-01 NOTE — Discharge Instructions (Addendum)
You were seen today for painful redness of your left upper back.  You have evidence of infection called cellulitis.  At this time you do not appear to have a draining abscess.  However, abscesses can perform.  If you note increasing swelling or purulent drainage, you may need to be reevaluated for drainage.  Take antibiotics as instructed.  If you notice increasing redness or fevers you should be reevaluated.  Apply warm compresses.

## 2018-03-01 NOTE — ED Triage Notes (Signed)
Presents with fluctuant, warm, erythemic, painful area to left shoulder that began Friday. He endorses chills last night and drainage. Pain is described as severe. Denies nausea and vomiting.

## 2018-03-01 NOTE — ED Provider Notes (Signed)
[facility] EMERGENCY DEPARTMENT Provider Note   CSN: [id number] Arrival date & time: 03/01/18  0009     History   Chief Complaint Chief Complaint  Patient presents with  . Abscess    HPI [name] is a 57 y.o. male.  HPI  This is a 57 year old male with a history of hypertension, hyperlipidemia who presents with painful swelling of the left upper back.  Patient noted pain and swelling to the left upper back and scapula on Friday.  Wife at the bedside states she has not noted any significant drainage.  She has applied warm compresses and "squeezed it."  They have noted redness.  No fevers.  Unsure whether he was bitten or had any injury.  Patient rates his pain 8 out of 10.  He has not taken anything for pain.  Denies any other symptoms.  Past Medical History:  Diagnosis Date  . BPH (benign prostatic hyperplasia)   . CKD (chronic kidney disease), stage III (HCC)    pt states he's unaware  . History of CVA in adulthood    09-20-2010  acute right superior cerebellar infarct/ pt denies deficits  . HTN (hypertension)    no meds x2 mos  . Hyperlipidemia   . Incomplete rotator cuff tear    left shoulder  . Mild intermittent asthma   . OA (osteoarthritis)    AC  joint left shoulder   . PFO (patent foramen ovale)    small patent PFO w/ left to right flow per TEE 12-09-2010  . Renal cyst, right    simple per ultrasound 11-19-2015  . Shoulder impingement, left   . Sleep apnea     Patient Active Problem List   Diagnosis Date Noted  . Chest pain 11/23/2017  . Chest pain with moderate risk of acute coronary syndrome   . SLAP lesion of left shoulder 08/22/2016  . Tendonitis of upper biceps tendon of left shoulder 08/22/2016  . Subacromial impingement of left shoulder 08/22/2016  . Arthritis of left acromioclavicular joint 08/22/2016  . H/O transient cerebral ischemia 07/02/2015  . 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 Benign essential HTN 05/11/2015  .  Asthma 09/03/2012  . Stroke [facility])     Past Surgical History:  Procedure Laterality Date  . ABDOMINAL SURGERY  1984   puncture wound; accident (chopping wood)  . SHOULDER ARTHROSCOPY WITH ROTATOR CUFF REPAIR AND OPEN BICEPS TENODESIS Left 08/22/2016   Procedure: BICEP TENODESIS;  Surgeon: Yolonda Kida, MD;  Location: [facility]  Service: Orthopedics;  Laterality: Left;  . TRANSESOPHAGEAL ECHOCARDIOGRAM  12/09/2010   normal LVF ;  small patent foreman ovale (PFO) by color doppler w/ left to right flow, with contrast injection there were a few large bubbles in left atrium/  trivial MR        Home Medications    Prior to Admission medications   Medication Sig Start Date End Date Taking? Authorizing Provider  colchicine 0.6 MG tablet Take by mouth. 01/09/18 04/09/18 Yes [provider]  losartan (COZAAR) 25 MG tablet Take by mouth. 01/09/18  Yes [provider]  albuterol (PROVENTIL HFA) 108 (90 BASE) MCG/ACT inhaler Inhale 2 puffs into the lungs every 6 (six) hours as needed for wheezing or shortness of breath.  10/20/13   [provider]  amLODipine (NORVASC) 10 MG tablet Take 10 mg by mouth daily. 09/21/17   [provider]  cephALEXin (KEFLEX) 500 MG capsule Take 1 capsule (500 mg total)  by mouth 4 (four) times daily. 03/01/18   Jaylan Hinojosa, Mayer Masker, MD  losartan (COZAAR) 25 MG tablet Take 25 mg by mouth daily. 09/21/17   [provider]  naproxen (NAPROSYN) 500 MG tablet Take 1 tablet (500 mg total) by mouth 2 (two) times daily. 03/01/18   Shlok Raz, Mayer Masker, MD    Family History Family History  Problem Relation Age of Onset  . Diabetes Mother   . Hypertension Mother   . Heart attack Mother 64  . Diabetes Sister   . Alcohol abuse Brother     Social History Social History   Tobacco Use  . Smoking status: Never Smoker  . Smokeless tobacco: Never Used  Substance Use Topics  . Alcohol use: No  . Drug use: No      Allergies   Allopurinol; Eggs or egg-derived products; and Other   Review of Systems Review of Systems  Constitutional: Negative for fever.  Respiratory: Negative for cough.   Cardiovascular: Negative for chest pain.  Gastrointestinal: Negative for nausea and vomiting.  Genitourinary: Negative for dysuria.  Musculoskeletal: Positive for back pain.  Skin: Positive for color change.  All other systems reviewed and are negative.    Physical Exam Updated Vital Signs BP (!) 148/75 (BP Location: Left Arm)   Pulse 68   Temp 98.5 F (36.9 C) (Oral)   Resp 20   Ht 6' (1.829 m)   Wt 104.3 kg (230 lb)   SpO2 97%   BMI 31.19 kg/m   Physical Exam  Constitutional: He is oriented to person, place, and time. He appears well-developed and well-nourished. No distress.  HENT:  Head: Normocephalic and atraumatic.  Cardiovascular: Normal rate and regular rhythm.  Pulmonary/Chest: Effort normal. No respiratory distress.  Musculoskeletal: He exhibits no edema.  Neurological: He is alert and oriented to person, place, and time.  Skin: Skin is warm and dry.  Tenderness to palpation with swelling noted over the left scapula, surrounding erythema, no fluctuance but there is induration, scabbing noted without drainage  Psychiatric: He has a normal mood and affect.  Nursing note and vitals reviewed.    ED Treatments / Results  Labs (all labs ordered are listed, but only abnormal results are displayed) Labs Reviewed - No data to display  EKG None  Radiology No results found.  Procedures Procedures (including critical care time)  EMERGENCY DEPARTMENT US SOFT TISSUE INTERPRETATION "Study: Limited Soft Tissue Ultrasound"  INDICATIONS: Pain and Soft tissue infection Multiple views of the body part were obtained in real-time with a multi-frequency linear probe  PERFORMED BY: Myself IMAGES ARCHIVED?: Yes SIDE:Left BODY PART:Upper back INTERPRETATION:  No abcess noted and  Cellulitis present    Medications Ordered in ED Medications  cephALEXin (KEFLEX) capsule 500 mg (has no administration in time range)  HYDROcodone-acetaminophen (NORCO/VICODIN) 5-325 MG per tablet 1 tablet (has no administration in time range)     Initial Impression / Assessment and Plan / ED Course  I have reviewed the triage vital signs and the nursing notes.  Pertinent labs & imaging results that were available during my care of the patient were reviewed by me and considered in my medical decision making (see chart for details).     Patient presents with redness and swelling of left upper back.  He is overall nontoxic-appearing on exam and afebrile.  Exam with induration but no fluctuance.  There is a scabbed area but no spontaneous drainage.  Ultrasound does not show any pocket or abscess that is  drainable.  Suspect isolated cellulitis at this time.  Patient is nontoxic-appearing.  He was given Norco for pain and a dose of Keflex.  We will plan for Keflex and continued hot compresses.  I discussed with patient and his wife that abscess could form.  If they note spontaneous drainage or increased swelling, they should be reevaluated.  Additionally if you develop fevers or increasing redness he should be reevaluated.  Patient and wife stated understanding.  After history, exam, and medical workup I feel the patient has been appropriately medically screened and is safe for discharge home. Pertinent diagnoses were discussed with the patient. Patient was given return precautions.   Final Clinical Impressions(s) / ED Diagnoses   Final diagnoses:  Cellulitis of back except buttock    ED Discharge Orders        Ordered    cephALEXin (KEFLEX) 500 MG capsule  4 times daily     03/01/18 0107    naproxen (NAPROSYN) 500 MG tablet  2 times daily     03/01/18 0107       Aracelis Ulrey, Mayer Maskerourtney F, MD 03/01/18 [phone]

## 2018-06-18 ENCOUNTER — Emergency Department (HOSPITAL_BASED_OUTPATIENT_CLINIC_OR_DEPARTMENT_OTHER)
Admission: EM | Admit: 2018-06-18 | Discharge: 2018-06-18 | Disposition: A | Discharge status: Home / Self Care | Payer: 59 | Attending: Emergency Medicine | Admitting: Emergency Medicine

## 2018-06-18 ENCOUNTER — Encounter (HOSPITAL_BASED_OUTPATIENT_CLINIC_OR_DEPARTMENT_OTHER): Payer: Self-pay | Admitting: *Deleted

## 2018-06-18 ENCOUNTER — Other Ambulatory Visit: Payer: Self-pay

## 2018-06-18 ENCOUNTER — Emergency Department (HOSPITAL_BASED_OUTPATIENT_CLINIC_OR_DEPARTMENT_OTHER): Payer: 59

## 2018-06-18 DIAGNOSIS — E785 Hyperlipidemia, unspecified: Secondary | ICD-10-CM | POA: Insufficient documentation

## 2018-06-18 DIAGNOSIS — I129 Hypertensive chronic kidney disease with stage 1 through stage 4 chronic kidney disease, or unspecified chronic kidney disease: Secondary | ICD-10-CM | POA: Diagnosis not present

## 2018-06-18 DIAGNOSIS — M10072 Idiopathic gout, left ankle and foot: Secondary | ICD-10-CM | POA: Diagnosis not present

## 2018-06-18 DIAGNOSIS — Z79899 Other long term (current) drug therapy: Secondary | ICD-10-CM | POA: Diagnosis not present

## 2018-06-18 DIAGNOSIS — N183 Chronic kidney disease, stage 3 (moderate): Secondary | ICD-10-CM | POA: Diagnosis not present

## 2018-06-18 DIAGNOSIS — M25572 Pain in left ankle and joints of left foot: Secondary | ICD-10-CM | POA: Diagnosis present

## 2018-06-18 HISTORY — DX: Gout, unspecified: M10.9

## 2018-06-18 MED ORDER — COLCHICINE 0.6 MG PO TABS: ORAL_TABLET | ORAL | 0 refills | Status: AC

## 2018-06-18 MED ORDER — COLCHICINE 0.6 MG PO TABS: ORAL_TABLET | ORAL | 0 refills | Status: DC

## 2018-06-18 MED ORDER — HYDROCODONE-ACETAMINOPHEN 5-325 MG PO TABS: 1.0000 | ORAL_TABLET | Freq: Four times a day (QID) | ORAL | 0 refills | Status: AC | PRN

## 2018-06-18 MED ORDER — HYDROCODONE-ACETAMINOPHEN 5-325 MG PO TABS: 1.0000 | ORAL_TABLET | Freq: Four times a day (QID) | ORAL | 0 refills | Status: DC | PRN

## 2018-06-18 NOTE — ED Provider Notes (Signed)
[facility] Provider Note: Lowella Dell, MD, FACEP  CSN: [id number] MRN: [id number] ARRIVAL: 06/18/18 at 1951 ROOM: MH02/MH02   CHIEF COMPLAINT  Ankle Pain   HISTORY OF PRESENT ILLNESS  06/18/18 11:17 PM [name] is a 57 y.o. male with a history of gout.  He has had gout in his big toes but not in his other joints.  He is not on allopurinol due to an allergy.  He is here with a 3-week history of pain and swelling in his left ankle.  He rates the pain as a 5 out of 10, worse with movement or palpation.  He denies taking any anti-inflammatory medications for this.  He has taken colchicine in the past but none currently.   Past Medical History:  Diagnosis Date  . BPH (benign prostatic hyperplasia)   . CKD (chronic kidney disease), stage III (HCC)    pt states he's unaware  . Gout   . History of CVA in adulthood    09-20-2010  acute right superior cerebellar infarct/ pt denies deficits  . HTN (hypertension)    no meds x2 mos  . Hyperlipidemia   . Incomplete rotator cuff tear    left shoulder  . Mild intermittent asthma   . OA (osteoarthritis)    AC  joint left shoulder   . PFO (patent foramen ovale)    small patent PFO w/ left to right flow per TEE 12-09-2010  . Renal cyst, right    simple per ultrasound 11-19-2015  . Shoulder impingement, left   . Sleep apnea     Past Surgical History:  Procedure Laterality Date  . ABDOMINAL SURGERY  1984   puncture wound; accident (chopping wood)  . SHOULDER ARTHROSCOPY WITH ROTATOR CUFF REPAIR AND OPEN BICEPS TENODESIS Left 08/22/2016   Procedure: BICEP TENODESIS;  Surgeon: Yolonda Kida, MD;  Location: [facility];  Service: Orthopedics;  Laterality: Left;  . TRANSESOPHAGEAL ECHOCARDIOGRAM  12/09/2010   normal LVF ;  small patent foreman ovale (PFO) by color doppler w/ left to right flow, with contrast injection there were a few large bubbles in left atrium/  trivial MR    Family History    Problem Relation Age of Onset  . Diabetes Mother   . Hypertension Mother   . Heart attack Mother 65  . Diabetes Sister   . Alcohol abuse Brother     Social History   Tobacco Use  . Smoking status: Never Smoker  . Smokeless tobacco: Never Used  Substance Use Topics  . Alcohol use: No  . Drug use: No    Prior to Admission medications   Medication Sig Start Date End Date Taking? Authorizing Provider  amLODipine (NORVASC) 10 MG tablet Take by mouth. 04/07/18 04/07/19 Yes [provider]  atorvastatin (LIPITOR) 40 MG tablet Take by mouth. 11/26/17  Yes [provider]  losartan (COZAAR) 25 MG tablet Take by mouth. 04/08/18  Yes [provider]  albuterol (PROVENTIL HFA) 108 (90 BASE) MCG/ACT inhaler Inhale 2 puffs into the lungs every 6 (six) hours as needed for wheezing or shortness of breath.  10/20/13   [provider]  amLODipine (NORVASC) 10 MG tablet Take 10 mg by mouth daily. 09/21/17   [provider]  cephALEXin (KEFLEX) 500 MG capsule Take 1 capsule (500 mg total) by mouth 4 (four) times daily. 03/01/18   Horton, Mayer Masker, MD  colchicine 0.6 MG tablet Take by mouth. 01/09/18 04/09/18  [provider]  losartan (COZAAR) 25 MG tablet Take 25 mg by mouth daily. 09/21/17   [provider]  losartan (COZAAR) 25 MG tablet Take by mouth. 01/09/18   [provider]  naproxen (NAPROSYN) 500 MG tablet Take 1 tablet (500 mg total) by mouth 2 (two) times daily. 03/01/18   Horton, Mayer Maskerourtney F, MD    Allergies Allopurinol; Eggs or egg-derived products; and Other   REVIEW OF SYSTEMS  Negative except as noted here or in the History of Present Illness.   PHYSICAL EXAMINATION  Initial Vital Signs Blood pressure (!) 145/70, pulse 60, temperature 99.2 F (37.3 C), temperature source Oral, resp. rate 16, height 6' (1.829 m), weight 106.6 kg, SpO2 97 %.  Examination General: Well-developed, well-nourished male in no acute  distress; appearance consistent with age of record HENT: normocephalic; atraumatic Eyes: pupils equal, round and reactive to light; extraocular muscles intact Neck: supple Heart: regular rate and rhythm Lungs: clear to auscultation bilaterally Abdomen: soft; nondistended; nontender; bowel sounds present Extremities: No deformity; swelling, tenderness and mild warmth of left ankle without erythema Neurologic: Awake, alert and oriented; motor function intact in all extremities and symmetric; no facial droop Skin: Warm and dry Psychiatric: Normal mood and affect   RESULTS  Summary of this visit's results, reviewed by myself:   EKG Interpretation  Date/Time:    Ventricular Rate:    PR Interval:    QRS Duration:   QT Interval:    QTC Calculation:   R Axis:     Text Interpretation:        Laboratory Studies: No results found for this or any previous visit (from the past 24 hour(s)). Imaging Studies: Dg Ankle Complete Left  Result Date: 06/18/2018 CLINICAL DATA:  Acute onset left ankle pain and swelling for 3 weeks without known injury. EXAM: LEFT ANKLE COMPLETE - 3+ VIEW COMPARISON:  None. FINDINGS: No acute fracture, malalignment or joint effusion. Well corticated ossific density projects off the tip of the malleolus possibly related to old remote trauma. Generalized diffuse soft tissue swelling of the included left leg and about the malleoli may reflect venous insufficiency or third spacing among some considerations. No acute fracture nor bone destruction. Calcaneal enthesopathy is noted along the dorsal aspect. IMPRESSION: Nonspecific generalized soft tissue swelling about the included left leg and ankle. No acute osseous abnormality. Electronically Signed   By: Tollie Ethavid  Kwon M.D.   On: 06/18/2018 20:43    ED COURSE and MDM  Nursing notes and initial vitals signs, including pulse oximetry, reviewed.  Vitals:   06/18/18 1954 06/18/18 1955 06/18/18 2245  BP: (!) 144/82  (!) 145/70   Pulse: 69  60  Resp: 18  16  Temp: 98.2 F (36.8 C)  99.2 F (37.3 C)  TempSrc:   Oral  SpO2: 98%  97%  Weight:  106.6 kg   Height:  6' (1.829 m)    Examination consistent with gout of the left ankle.  We will place him on colchicine.  We will avoid NSAIDs due to elevated creatinine.  [place] [facility] [place] [place] reveals the patient has received 5 prescriptions for opioid pain medication in the past 2 years, most recently May of this year.   PROCEDURES    ED DIAGNOSES     ICD-10-CM   1. Acute idiopathic gout of left ankle M10.072        Paula LibraMolpus, Caidyn Blossom, MD 06/18/18 [phone]

## 2018-06-18 NOTE — ED Triage Notes (Signed)
Pt says that for about 3 weeks he has had pain in his left lateral ankle with swelling. He also reports some swelling in his lower leg. Hx of gout, does not feel the same. Denies any injury. Has been taking OTC meds without relief.

## 2019-02-26 ENCOUNTER — Other Ambulatory Visit: Payer: Self-pay

## 2019-02-26 ENCOUNTER — Emergency Department (HOSPITAL_COMMUNITY): Payer: 59

## 2019-02-26 ENCOUNTER — Encounter (HOSPITAL_COMMUNITY): Payer: Self-pay

## 2019-02-26 ENCOUNTER — Emergency Department (HOSPITAL_COMMUNITY)
Admission: EM | Admit: 2019-02-26 | Discharge: 2019-02-26 | Disposition: A | Discharge status: Home / Self Care | Payer: 59 | Attending: Emergency Medicine | Admitting: Emergency Medicine

## 2019-02-26 DIAGNOSIS — Z79899 Other long term (current) drug therapy: Secondary | ICD-10-CM | POA: Diagnosis not present

## 2019-02-26 DIAGNOSIS — I129 Hypertensive chronic kidney disease with stage 1 through stage 4 chronic kidney disease, or unspecified chronic kidney disease: Secondary | ICD-10-CM | POA: Diagnosis not present

## 2019-02-26 DIAGNOSIS — Z888 Allergy status to other drugs, medicaments and biological substances status: Secondary | ICD-10-CM | POA: Diagnosis not present

## 2019-02-26 DIAGNOSIS — E785 Hyperlipidemia, unspecified: Secondary | ICD-10-CM | POA: Insufficient documentation

## 2019-02-26 DIAGNOSIS — N183 Chronic kidney disease, stage 3 (moderate): Secondary | ICD-10-CM | POA: Diagnosis not present

## 2019-02-26 DIAGNOSIS — R1032 Left lower quadrant pain: Secondary | ICD-10-CM | POA: Diagnosis present

## 2019-02-26 DIAGNOSIS — Z91012 Allergy to eggs: Secondary | ICD-10-CM | POA: Diagnosis not present

## 2019-02-26 DIAGNOSIS — R109 Unspecified abdominal pain: Secondary | ICD-10-CM

## 2019-02-26 DIAGNOSIS — Z8673 Personal history of transient ischemic attack (TIA), and cerebral infarction without residual deficits: Secondary | ICD-10-CM | POA: Insufficient documentation

## 2019-02-26 DIAGNOSIS — J45909 Unspecified asthma, uncomplicated: Secondary | ICD-10-CM | POA: Insufficient documentation

## 2019-02-26 DIAGNOSIS — M62838 Other muscle spasm: Secondary | ICD-10-CM

## 2019-02-26 LAB — CBC WITH DIFFERENTIAL/PLATELET
Abs Immature Granulocytes: 0.02 10*3/uL (ref 0.00–0.07)
Basophils Absolute: 0 10*3/uL (ref 0.0–0.1)
Basophils Relative: 0 %
Eosinophils Absolute: 0.1 10*3/uL (ref 0.0–0.5)
Eosinophils Relative: 2 %
HCT: 44.5 % (ref 39.0–52.0)
Hemoglobin: 15.2 g/dL (ref 13.0–17.0)
Immature Granulocytes: 0 %
Lymphocytes Relative: 39 %
Lymphs Abs: 2.8 10*3/uL (ref 0.7–4.0)
MCH: 32.1 pg (ref 26.0–34.0)
MCHC: 34.2 g/dL (ref 30.0–36.0)
MCV: 94.1 fL (ref 80.0–100.0)
Monocytes Absolute: 0.8 10*3/uL (ref 0.1–1.0)
Monocytes Relative: 11 %
Neutro Abs: 3.4 10*3/uL (ref 1.7–7.7)
Neutrophils Relative %: 48 %
Platelets: 187 10*3/uL (ref 150–400)
RBC: 4.73 MIL/uL (ref 4.22–5.81)
RDW: 14.1 % (ref 11.5–15.5)
WBC: 7.1 10*3/uL (ref 4.0–10.5)
nRBC: 0 % (ref 0.0–0.2)

## 2019-02-26 LAB — URINALYSIS, ROUTINE W REFLEX MICROSCOPIC
Bilirubin Urine: NEGATIVE
Glucose, UA: NEGATIVE mg/dL
Hgb urine dipstick: NEGATIVE
Ketones, ur: NEGATIVE mg/dL
Leukocytes,Ua: NEGATIVE
Nitrite: NEGATIVE
Protein, ur: NEGATIVE mg/dL
Specific Gravity, Urine: 1.017 (ref 1.005–1.030)
pH: 6 (ref 5.0–8.0)

## 2019-02-26 LAB — COMPREHENSIVE METABOLIC PANEL
ALT: 29 U/L (ref 0–44)
AST: 23 U/L (ref 15–41)
Albumin: 4.6 g/dL (ref 3.5–5.0)
Alkaline Phosphatase: 47 U/L (ref 38–126)
Anion gap: 11 (ref 5–15)
BUN: 21 mg/dL — ABNORMAL HIGH (ref 6–20)
CO2: 27 mmol/L (ref 22–32)
Calcium: 9.6 mg/dL (ref 8.9–10.3)
Chloride: 102 mmol/L (ref 98–111)
Creatinine, Ser: 1.42 mg/dL — ABNORMAL HIGH (ref 0.61–1.24)
GFR calc Af Amer: >60 mL/min (ref >60)
GFR calc non Af Amer: 54 mL/min — ABNORMAL LOW (ref >60)
Glucose, Bld: 97 mg/dL (ref 70–99)
Potassium: 3.7 mmol/L (ref 3.5–5.1)
Sodium: 140 mmol/L (ref 135–145)
Total Bilirubin: 0.9 mg/dL (ref 0.3–1.2)
Total Protein: 8.3 g/dL — ABNORMAL HIGH (ref 6.5–8.1)

## 2019-02-26 MED ORDER — KETOROLAC TROMETHAMINE 15 MG/ML IJ SOLN
15.0000 mg | Freq: Once | INTRAMUSCULAR | Status: AC
  Administered 2019-02-26: 15 mg via INTRAVENOUS
  Filled 2019-02-26: qty 1

## 2019-02-26 MED ORDER — ACETAMINOPHEN 325 MG PO TABS
650.0000 mg | ORAL_TABLET | Freq: Once | ORAL | Status: AC
  Administered 2019-02-26: 650 mg via ORAL
  Filled 2019-02-26: qty 2

## 2019-02-26 MED ORDER — MORPHINE SULFATE (PF) 4 MG/ML IV SOLN
4.0000 mg | Freq: Once | INTRAVENOUS | Status: AC
  Administered 2019-02-26: 4 mg via INTRAVENOUS
  Filled 2019-02-26: qty 1

## 2019-02-26 MED ORDER — CYCLOBENZAPRINE HCL 10 MG PO TABS: 5.0000 mg | ORAL_TABLET | Freq: Two times a day (BID) | ORAL | 0 refills | Status: AC | PRN

## 2019-02-26 MED ORDER — DIAZEPAM 2 MG PO TABS
2.0000 mg | ORAL_TABLET | Freq: Once | ORAL | Status: AC
  Administered 2019-02-26: 2 mg via ORAL
  Filled 2019-02-26: qty 1

## 2019-02-26 NOTE — ED Triage Notes (Signed)
Left flank pain for a couple of days no n/v no abdominal pain voiced has dysuria voiced no hematuria voiced.

## 2019-02-26 NOTE — Discharge Instructions (Signed)
Recommend the prescribed medication as needed. Additionally recommend taking pain Tylenol for pain control.  If you have worsening symptoms, develop vomiting, fevers, difficulty breathing, chest pain or other new concerning symptom, recommend returning to the ER for reassessment.

## 2019-02-26 NOTE — ED Provider Notes (Signed)
[facility]-EMERGENCY DEPT Provider Note   CSN: [id number] Arrival date & time: 02/26/19  1043     History   Chief Complaint Chief Complaint  Patient presents with  . Flank Pain    HPI [name] is a 58 y.o. male.  Suggested approximately 2 days ago, is been having moderate to severe left flank pain, up to 10 out of 10 in severity, sharp, stabbing pain.  Nonradiating, no alleviating factors identified.  Has not taking any medication for this today.  Had a fever yesterday up to 101.  Unsure of having any fever today.  Denies any cough, difficulty breathing.  Has had dysuria and increased frequency in urination.  No nausea, vomiting, diarrhea or other abdominal pain noted.  Denies any past medical history, denies history of kidney stones.     HPI  Past Medical History:  Diagnosis Date  . BPH (benign prostatic hyperplasia)   . CKD (chronic kidney disease), stage III (HCC)    pt states he's unaware  . Gout   . History of CVA in adulthood    09-20-2010  acute right superior cerebellar infarct/ pt denies deficits  . HTN (hypertension)    no meds x2 mos  . Hyperlipidemia   . Incomplete rotator cuff tear    left shoulder  . Mild intermittent asthma   . OA (osteoarthritis)    AC  joint left shoulder   . PFO (patent foramen ovale)    small patent PFO w/ left to right flow per TEE 12-09-2010  . Renal cyst, right    simple per ultrasound 11-19-2015  . Shoulder impingement, left   . Sleep apnea     Patient Active Problem List   Diagnosis Date Noted  . Chest pain 11/23/2017  . Chest pain with moderate risk of acute coronary syndrome   . SLAP lesion of left shoulder 08/22/2016  . Tendonitis of upper biceps tendon of left shoulder 08/22/2016  . Subacromial impingement of left shoulder 08/22/2016  . Arthritis of left acromioclavicular joint 08/22/2016  . H/O transient cerebral ischemia 07/02/2015  . 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 Benign  essential HTN 05/11/2015  . Asthma 09/03/2012  . Stroke [facility](HCC)     Past Surgical History:  Procedure Laterality Date  . ABDOMINAL SURGERY  1984   puncture wound; accident (chopping wood)  . SHOULDER ARTHROSCOPY WITH ROTATOR CUFF REPAIR AND OPEN BICEPS TENODESIS Left 08/22/2016   Procedure: BICEP TENODESIS;  Surgeon: Yolonda KidaJason Patrick Rogers, MD;  [facility];  Service: Orthopedics;  Laterality: Left;  . TRANSESOPHAGEAL ECHOCARDIOGRAM  12/09/2010   normal LVF ;  small patent foreman ovale (PFO) by color doppler w/ left to right flow, with contrast injection there were a few large bubbles in left atrium/  trivial MR        Home Medications    Prior to Admission medications   Medication Sig Start Date End Date Taking? Authorizing Provider  albuterol (PROVENTIL HFA) 108 (90 BASE) MCG/ACT inhaler Inhale 2 puffs into the lungs every 6 (six) hours as needed for wheezing or shortness of breath.  10/20/13   [provider]  amLODipine (NORVASC) 10 MG tablet Take by mouth. 04/07/18 04/07/19  [provider]  atorvastatin (LIPITOR) 40 MG tablet Take by mouth. 11/26/17   [provider]  colchicine 0.6 MG tablet Take 2 tablets followed by 1 tablet 1 hour later as needed for gout attack.  May repeat every 3 days until gout  attack resolves. 06/18/18   Molpus, John, MD  HYDROcodone-acetaminophen (NORCO) 5-325 MG tablet Take 1 tablet by mouth every 6 (six) hours as needed (for pain). 06/18/18   Molpus, John, MD  losartan (COZAAR) 25 MG tablet Take 25 mg by mouth daily. 09/21/17   [provider]  naproxen (NAPROSYN) 500 MG tablet Take 1 tablet (500 mg total) by mouth 2 (two) times daily. 03/01/18   Horton, Mayer Maskerourtney F, MD    Family History Family History  Problem Relation Age of Onset  . Diabetes Mother   . Hypertension Mother   . Heart attack Mother [id number]  . Diabetes Sister   . Alcohol abuse Brother     Social History Social History   Tobacco  Use  . Smoking status: Never Smoker  . Smokeless tobacco: Never Used  Substance Use Topics  . Alcohol use: No  . Drug use: No     Allergies   Allopurinol, Eggs or egg-derived products, and Other   Review of Systems Review of Systems  Constitutional: Negative for chills and fever.  HENT: Negative for ear pain and sore throat.   Eyes: Negative for pain and visual disturbance.  Respiratory: Negative for cough and shortness of breath.   Cardiovascular: Negative for chest pain and palpitations.  Gastrointestinal: Negative for abdominal pain and vomiting.  Genitourinary: Positive for dysuria and frequency. Negative for hematuria.  Musculoskeletal: Negative for arthralgias and back pain.  Skin: Negative for color change and rash.  Neurological: Negative for seizures and syncope.  All other systems reviewed and are negative.    Physical Exam Updated Vital Signs BP (!) 143/81   Pulse 62   Temp 97.8 F (36.6 C) (Oral)   Resp 20   Ht 6' (1.829 m)   Wt 106.6 kg   SpO2 98%   BMI 31.87 kg/m   Physical Exam Vitals signs and nursing note reviewed.  Constitutional:      Appearance: He is well-developed.  HENT:     Head: Normocephalic and atraumatic.  Eyes:     Conjunctiva/sclera: Conjunctivae normal.  Neck:     Musculoskeletal: Neck supple.  Cardiovascular:     Rate and Rhythm: Normal rate and regular rhythm.     Heart sounds: No murmur.  Pulmonary:     Effort: Pulmonary effort is normal. No respiratory distress.     Breath sounds: Normal breath sounds.  Abdominal:     General: Bowel sounds are normal.     Palpations: Abdomen is soft.     Comments: Tenderness palpation left flank, soft abdomen, no abdominal tenderness palpation  Musculoskeletal:        General: No swelling or tenderness.  Skin:    General: Skin is warm and dry.     Capillary Refill: Capillary refill takes less than 2 seconds.  Neurological:     Mental Status: He is alert.      ED Treatments /  Results  Labs (all labs ordered are listed, but only abnormal results are displayed) Labs Reviewed  URINALYSIS, ROUTINE W REFLEX MICROSCOPIC  CBC WITH DIFFERENTIAL/PLATELET  COMPREHENSIVE METABOLIC PANEL    EKG None  Radiology Ct Abdomen Pelvis Wo Contrast  Result Date: 02/26/2019 CLINICAL DATA:  Left flank pain for few days. EXAM: CT ABDOMEN AND PELVIS WITHOUT CONTRAST TECHNIQUE: Multidetector CT imaging of the abdomen and pelvis was performed following the standard protocol without IV contrast. COMPARISON:  None. FINDINGS: Lower chest: Linear atelectasis of the posterior lung bases are identified. The heart size is  mildly enlarged. Hepatobiliary: There is diffuse low density of the liver. No focal liver lesion is identified. The gallbladder is normal. The biliary tree is normal. Pancreas: Unremarkable. No pancreatic ductal dilatation or surrounding inflammatory changes. Spleen: Normal in size without focal abnormality. Adrenals/Urinary Tract: The bilateral adrenal glands are normal. There are no kidney stones or hydronephrosis bilaterally. There is a 1.6 cm cyst in the mid to lower pole right kidney. Bladder is normal. Stomach/Bowel: Stomach is within normal limits. Appendix appears normal. No evidence of bowel wall thickening, distention, or inflammatory changes. Vascular/Lymphatic: No significant vascular findings are present. No enlarged abdominal or pelvic lymph nodes. Reproductive: Prostate is unremarkable. Other: None Musculoskeletal: Degenerative joint changes of the spine are noted. IMPRESSION: No kidney stones or hydronephrosis bilaterally. No acute abnormality identified in the abdomen pelvis. 1.6 cm cyst in the right kidney. Fatty infiltration of liver. Electronically Signed   By: Abelardo Diesel M.D.   On: 02/26/2019 16:49    Procedures Procedures (including critical care time)  Medications Ordered in ED Medications  ketorolac (TORADOL) 15 MG/ML injection 15 mg (15 mg Intravenous  Given 02/26/19 1656)  acetaminophen (TYLENOL) tablet 650 mg (650 mg Oral Given 02/26/19 1655)  morphine 4 MG/ML injection 4 mg (4 mg Intravenous Given 02/26/19 1854)  diazepam (VALIUM) tablet 2 mg (2 mg Oral Given 02/26/19 1854)     Initial Impression / Assessment and Plan / ED Course  I have reviewed the triage vital signs and the nursing notes.  Pertinent labs & imaging results that were available during my care of the patient were reviewed by me and considered in my medical decision making (see chart for details).        58 year old gentleman presents emergency department chief complaint severe left flank pain, dysuria..  No UTI noted on urine.  CT scan negative for stones, hydronephrosis.  Labs unremarkable, creatinine at baseline.  Given description of pain, suspect more likely muscle spasm.  Will give short course of Flexeril, recommended recheck with PCP.    After the discussed management above, the patient was determined to be safe for discharge.  The patient was in agreement with this plan and all questions regarding their care were answered.  ED return precautions were discussed and the patient will return to the ED with any significant worsening of condition.    Final Clinical Impressions(s) / ED Diagnoses   Final diagnoses:  Acute left flank pain  Muscle spasm    ED Discharge Orders    None       Lucrezia Starch, MD 02/26/19 2203

## 2020-10-21 ENCOUNTER — Emergency Department (HOSPITAL_BASED_OUTPATIENT_CLINIC_OR_DEPARTMENT_OTHER): Payer: Self-pay

## 2020-10-21 ENCOUNTER — Other Ambulatory Visit: Payer: Self-pay

## 2020-10-21 ENCOUNTER — Emergency Department (HOSPITAL_BASED_OUTPATIENT_CLINIC_OR_DEPARTMENT_OTHER)
Admission: EM | Admit: 2020-10-21 | Discharge: 2020-10-21 | Disposition: A | Discharge status: Home / Self Care | Payer: Self-pay | Attending: Emergency Medicine | Admitting: Emergency Medicine

## 2020-10-21 ENCOUNTER — Encounter (HOSPITAL_BASED_OUTPATIENT_CLINIC_OR_DEPARTMENT_OTHER): Payer: Self-pay | Admitting: Emergency Medicine

## 2020-10-21 DIAGNOSIS — I129 Hypertensive chronic kidney disease with stage 1 through stage 4 chronic kidney disease, or unspecified chronic kidney disease: Secondary | ICD-10-CM | POA: Insufficient documentation

## 2020-10-21 DIAGNOSIS — Z79899 Other long term (current) drug therapy: Secondary | ICD-10-CM | POA: Insufficient documentation

## 2020-10-21 DIAGNOSIS — M79645 Pain in left finger(s): Secondary | ICD-10-CM | POA: Insufficient documentation

## 2020-10-21 DIAGNOSIS — J4520 Mild intermittent asthma, uncomplicated: Secondary | ICD-10-CM | POA: Insufficient documentation

## 2020-10-21 DIAGNOSIS — N183 Chronic kidney disease, stage 3 unspecified: Secondary | ICD-10-CM | POA: Insufficient documentation

## 2020-10-21 MED ORDER — CEPHALEXIN 500 MG PO CAPS: 500.0000 mg | ORAL_CAPSULE | Freq: Two times a day (BID) | ORAL | 0 refills | Status: AC

## 2020-10-21 NOTE — ED Provider Notes (Signed)
[facility] EMERGENCY DEPARTMENT Provider Note   CSN: [id number] Arrival date & time: 10/21/20  3734     History Chief Complaint  Patient presents with  . Finger Injury    [name] is a 60 y.o. male.  Left middle finger pain for three weeks, overall improving but some swelling in knuckle area over dried skin. No trauma, no fever. Some stiffness with moving finger.  The history is provided by the patient.  Hand Pain This is a new problem. The current episode started more than 1 week ago. The problem occurs daily. The problem has been gradually improving. Exacerbated by: movement. Nothing relieves the symptoms. He has tried nothing for the symptoms. The treatment provided no relief.       Past Medical History:  Diagnosis Date  . BPH (benign prostatic hyperplasia)   . CKD (chronic kidney disease), stage III (HCC)    pt states he's unaware  . Gout   . History of CVA in adulthood    09-20-2010  acute right superior cerebellar infarct/ pt denies deficits  . HTN (hypertension)    no meds x2 mos  . Hyperlipidemia   . Incomplete rotator cuff tear    left shoulder  . Mild intermittent asthma   . OA (osteoarthritis)    AC  joint left shoulder   . PFO (patent foramen ovale)    small patent PFO w/ left to right flow per TEE 12-09-2010  . Renal cyst, right    simple per ultrasound 11-19-2015  . Shoulder impingement, left   . Sleep apnea     Patient Active Problem List   Diagnosis Date Noted  . Chest pain 11/23/2017  . Chest pain with moderate risk of acute coronary syndrome   . SLAP lesion of left shoulder 08/22/2016  . Tendonitis of upper biceps tendon of left shoulder 08/22/2016  . Subacromial impingement of left shoulder 08/22/2016  . Arthritis of left acromioclavicular joint 08/22/2016  . H/O transient cerebral ischemia 07/02/2015  . 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 Benign essential HTN 05/11/2015  . Asthma 09/03/2012  . Stroke [facility])      Past Surgical History:  Procedure Laterality Date  . ABDOMINAL SURGERY  1984   puncture wound; accident (chopping wood)  . SHOULDER ARTHROSCOPY WITH ROTATOR CUFF REPAIR AND OPEN BICEPS TENODESIS Left 08/22/2016   Procedure: BICEP TENODESIS;  Surgeon: Yolonda Kida, MD;  Location: [facility];  Service: Orthopedics;  Laterality: Left;  . TRANSESOPHAGEAL ECHOCARDIOGRAM  12/09/2010   normal LVF ;  small patent foreman ovale (PFO) by color doppler w/ left to right flow, with contrast injection there were a few large bubbles in left atrium/  trivial MR       Family History  Problem Relation Age of Onset  . Diabetes Mother   . Hypertension Mother   . Heart attack Mother 58  . Diabetes Sister   . Alcohol abuse Brother     Social History   Tobacco Use  . Smoking status: Never Smoker  . Smokeless tobacco: Never Used  Vaping Use  . Vaping Use: Never used  Substance Use Topics  . Alcohol use: No  . Drug use: No    Home Medications Prior to Admission medications   Medication Sig Start Date End Date Taking? Authorizing Provider  losartan (COZAAR) 25 MG tablet Take 25 mg by mouth daily. 09/21/17  Yes [provider]  albuterol (PROVENTIL HFA) 108 (90 BASE) MCG/ACT inhaler Inhale 2  puffs into the lungs every 6 (six) hours as needed for wheezing or shortness of breath.  10/20/13   [provider]  amLODipine (NORVASC) 10 MG tablet Take by mouth. 04/07/18 04/07/19  [provider]  atorvastatin (LIPITOR) 40 MG tablet Take by mouth. 11/26/17   [provider]  cephALEXin (KEFLEX) 500 MG capsule Take 1 capsule (500 mg total) by mouth 2 (two) times daily for 7 days. 10/21/20 10/28/20 Yes Curatolo, Adam, DO  colchicine 0.6 MG tablet Take 2 tablets followed by 1 tablet 1 hour later as needed for gout attack.  May repeat every 3 days until gout attack resolves. 06/18/18   Molpus, John, MD  cyclobenzaprine (FLEXERIL) 10 MG tablet Take 0.5  tablets (5 mg total) by mouth 2 (two) times daily as needed for muscle spasms. 02/26/19   Milagros Loll, MD  HYDROcodone-acetaminophen (NORCO) 5-325 MG tablet Take 1 tablet by mouth every 6 (six) hours as needed (for pain). 06/18/18   Molpus, John, MD  naproxen (NAPROSYN) 500 MG tablet Take 1 tablet (500 mg total) by mouth 2 (two) times daily. 03/01/18   Horton, Mayer Masker, MD    Allergies    Allopurinol, Eggs or egg-derived products, and Other  Review of Systems   Review of Systems  Constitutional: Negative for fever.  Musculoskeletal: Positive for arthralgias and joint swelling.  Skin: Positive for wound. Negative for color change, pallor and rash.  Neurological: Negative for weakness and numbness.    Physical Exam Updated Vital Signs BP (!) 152/90 (BP Location: Right Arm)   Temp 97.8 F (36.6 C) (Oral)   Resp 20   Ht 6' (1.829 m)   SpO2 100%   BMI 31.87 kg/m   Physical Exam Constitutional:      General: He is not in acute distress.    Appearance: He is not ill-appearing.  Cardiovascular:     Pulses: Normal pulses.  Musculoskeletal:        General: Tenderness present.     Comments: Tenderness to the left finger but good range of motion with flexion extension of the fingers  Skin:    General: Skin is warm.     Comments: Left middle finger with scab/dried wart appearing wound over the left middle knuckle, no redness or major swelling, no warmth  Neurological:     Mental Status: He is alert.     Sensory: No sensory deficit.     Motor: No weakness.     ED Results / Procedures / Treatments   Labs (all labs ordered are listed, but only abnormal results are displayed) Labs Reviewed - No data to display  EKG None  Radiology No results found.  Procedures Procedures   Medications Ordered in ED Medications - No data to display  ED Course  I have reviewed the triage vital signs and the nursing notes.  Pertinent labs & imaging results that were available during  my care of the patient were reviewed by me and considered in my medical decision making (see chart for details).    MDM Rules/Calculators/A&P                          [name] is here with left middle finger pain.  Pain for the last 3 weeks.  Overall appears to have a hard scab over the left middle finger over the middle knuckle.  Suspect that this is a callus that has hardened over.  X-ray showed no  fracture.  No concern for flexor tenosynovitis.  No obvious signs of infection but will do short course of antibiotics to help with any inflammation.  Recommend Motrin as well.  Recommend follow-up with primary care doctor.  This chart was dictated using voice recognition software.  Despite best efforts to proofread,  errors can occur which can change the documentation meaning.   Final Clinical Impression(s) / ED Diagnoses Final diagnoses:  Pain of finger of left hand    Rx / DC Orders ED Discharge Orders         Ordered    cephALEXin (KEFLEX) 500 MG capsule  2 times daily        10/21/20 0819           Virgina Norfolk, DO 10/21/20 0820

## 2020-10-21 NOTE — ED Triage Notes (Signed)
Pt reports left ring finger swollen for 3 weeks, not responding to high doses of tylenol and advil daily with no relief. Pt reports no gout flare ups in feet for about a year.   Melodye Ped

## 2020-10-21 NOTE — ED Notes (Signed)
ED Provider at bedside. 

## 2020-10-21 NOTE — ED Notes (Signed)
Pt dc home. Reviewed dc instructions ,meds,pain control. Pt made aware to take full coarse of antibiotics.Melodye Ped

## 2021-08-03 ENCOUNTER — Other Ambulatory Visit: Payer: Self-pay

## 2021-08-03 ENCOUNTER — Emergency Department (HOSPITAL_BASED_OUTPATIENT_CLINIC_OR_DEPARTMENT_OTHER)
Admission: EM | Admit: 2021-08-03 | Discharge: 2021-08-03 | Disposition: A | Discharge status: Home / Self Care | Payer: Self-pay | Attending: Emergency Medicine | Admitting: Emergency Medicine

## 2021-08-03 ENCOUNTER — Emergency Department (HOSPITAL_BASED_OUTPATIENT_CLINIC_OR_DEPARTMENT_OTHER): Payer: Self-pay

## 2021-08-03 ENCOUNTER — Encounter (HOSPITAL_BASED_OUTPATIENT_CLINIC_OR_DEPARTMENT_OTHER): Payer: Self-pay | Admitting: Emergency Medicine

## 2021-08-03 DIAGNOSIS — L03114 Cellulitis of left upper limb: Secondary | ICD-10-CM | POA: Insufficient documentation

## 2021-08-03 MED ORDER — DOXYCYCLINE HYCLATE 100 MG PO CAPS: 100.0000 mg | ORAL_CAPSULE | Freq: Two times a day (BID) | ORAL | 0 refills | Status: AC

## 2021-08-03 NOTE — ED Notes (Signed)
ED Provider at bedside. 

## 2021-08-03 NOTE — Discharge Instructions (Addendum)
Your x-rays today showed some soft tissue swelling around your elbow, however your joint is without signs of fracture or abnormality.  It is likely that the area around your elbow has a skin infection called cellulitis. I have sent you in a prescription for antibiotics for you to take twice daily for 10 days.  I am also giving you an orthopedic follow-up so they can take a better look at your joint determine whether or not it needs to be aspirated. We are also unable to fully rule out a gout flareup without this aspiration, so please take your colchicine as you normally would if you had a typical flareup.  If symptoms worsen, you can follow-up with your primary care doctor.  Otherwise I have given you the number for an orthopedic physician for you to call.

## 2021-08-03 NOTE — ED Triage Notes (Signed)
Pt arrives pov, ambulatory to triage with c/o left elbow pain and swelling x 1 week. Denies injury

## 2021-08-03 NOTE — ED Provider Notes (Signed)
[facility] EMERGENCY DEPARTMENT Provider Note   CSN: [id number] Arrival date & time: 08/03/21  1016     History  Chief Complaint  Patient presents with   Joint Swelling    [name] is a 61 y.o. male with hx of gout presents for pain and swelling of his left elbow. Pt's symptoms started over a week ago without known injury or trauma. Pain has stayed the same and has not responded to ibuprofen. He describes his pain as sharp/throbbing and says this feels different from gout flareups and that usually his gout lasts for about 3 days at a time. He is on allopurinol. Pain worse on elbow flexion. Denies fevers, chills, n/v/d, numbness, tingling.  HPI     Home Medications Prior to Admission medications   Medication Sig Start Date End Date Taking? Authorizing Provider  allopurinol (ZYLOPRIM) 300 MG tablet Take 300 mg by mouth 1 day or 1 dose. 04/27/19  Yes [provider]  albuterol (PROVENTIL HFA) 108 (90 BASE) MCG/ACT inhaler Inhale 2 puffs into the lungs every 6 (six) hours as needed for wheezing or shortness of breath.  10/20/13   [provider]  amLODipine (NORVASC) 10 MG tablet Take by mouth. 04/07/18 04/07/19  [provider]  atorvastatin (LIPITOR) 40 MG tablet Take by mouth. 11/26/17   [provider]  colchicine 0.6 MG tablet Take 2 tablets followed by 1 tablet 1 hour later as needed for gout attack.  May repeat every 3 days until gout attack resolves. 06/18/18   Molpus, John, MD  cyclobenzaprine (FLEXERIL) 10 MG tablet Take 0.5 tablets (5 mg total) by mouth 2 (two) times daily as needed for muscle spasms. 02/26/19   Lucrezia Starch, MD  HYDROcodone-acetaminophen (NORCO) 5-325 MG tablet Take 1 tablet by mouth every 6 (six) hours as needed (for pain). 06/18/18   Molpus, John, MD  losartan (COZAAR) 25 MG tablet Take 25 mg by mouth daily. 09/21/17   [provider]  naproxen (NAPROSYN) 500 MG tablet Take 1 tablet (500 mg total) by  mouth 2 (two) times daily. 03/01/18   Horton, Barbette Hair, MD      Allergies    Allopurinol, Eggs or egg-derived products, and Other    Review of Systems   Review of Systems  Constitutional:  Negative for fever.  HENT: Negative.    Eyes: Negative.   Respiratory:  Negative for shortness of breath.   Cardiovascular: Negative.   Gastrointestinal:  Negative for abdominal pain and vomiting.  Endocrine: Negative.   Genitourinary: Negative.   Musculoskeletal:  Positive for arthralgias and joint swelling.  Skin:  Negative for rash.  Neurological:  Negative for headaches.  All other systems reviewed and are negative.  Physical Exam Updated Vital Signs BP (!) 154/82 (BP Location: Right Arm)    Pulse 77    Temp 98.2 F (36.8 C)    Resp 18    Ht 6' (1.829 m)    Wt 106.6 kg    SpO2 99%    BMI 31.87 kg/m  Physical Exam Vitals and nursing note reviewed.  Constitutional:      General: He is not in acute distress.    Appearance: He is not ill-appearing.  HENT:     Head: Atraumatic.  Eyes:     Conjunctiva/sclera: Conjunctivae normal.  Cardiovascular:     Rate and Rhythm: Normal rate and regular rhythm.     Pulses: Normal pulses.     Heart sounds: No murmur heard.  Pulmonary:     Effort: Pulmonary effort is normal. No respiratory distress.     Breath sounds: Normal breath sounds.  Abdominal:     General: Abdomen is flat. There is no distension.     Palpations: Abdomen is soft.     Tenderness: There is no abdominal tenderness.  Musculoskeletal:        General: Normal range of motion.     Cervical back: Normal range of motion.     Comments: Moderate swelling and tenderness over the left olecranon. Elbow is warm to touch. Passive ROM on extension intact, slightly limited on flexion d/t pain. 2+ radial pulses. Sensation intact.  Skin:    General: Skin is warm and dry.     Capillary Refill: Capillary refill takes less than 2 seconds.     Comments: Erythema and induration spreading proximally  from posterior elbow. TTP  Neurological:     General: No focal deficit present.     Mental Status: He is alert.  Psychiatric:        Mood and Affect: Mood normal.    ED Results / Procedures / Treatments   Labs (all labs ordered are listed, but only abnormal results are displayed) Labs Reviewed - No data to display  EKG None  Radiology DG Elbow Complete Left  Result Date: 08/03/2021 CLINICAL DATA:  Left elbow pain and swelling for 1 week. No reported injury. EXAM: LEFT ELBOW - COMPLETE 3+ VIEW COMPARISON:  None. FINDINGS: No fracture or bone lesion. Elbow joint normally spaced and aligned. No arthropathic changes. No joint effusion. There is soft tissue swelling that is most evident posteriorly. This may reflect cellulitis or nonspecific edema. IMPRESSION: 1. Soft tissue swelling most evident posteriorly, nonspecific. 2. No fracture or bone lesion.  No elbow joint abnormality. Electronically Signed   By: Amie Portland M.D.   On: 08/03/2021 11:30    Procedures Procedures    Medications Ordered in ED Medications - No data to display  ED Course/ Medical Decision Making/ A&P                           Medical Decision Making  This patient with hx of gout presents to the ED for concern of left elbow swelling and pain for one week, this involves an extensive number of treatment options, and is a complaint that carries with it a high risk of complications and morbidity.  The differential diagnosis includes gout flare-up, septic arthritis, cellulitis, bursitis, traumatic injury    Additional history obtained:  External records from outside source obtained and reviewed including PCP records detailing previous gout flare-ups   Lab Tests:  No labs indicated   Imaging Studies ordered:  I ordered imaging studies including left elbow xray  I independently visualized and interpreted imaging which showed posterior soft tissue swelling with no bone fracture or deformity I agree with the  radiologist interpretation    Dispostion:  After consideration of the diagnostic results and the patient's physical exam and imaging results, I feel that the patent would benefit from outpatient treatment with orthopedic follow-up.   Cellulitis of left elbow - Given the appearance of the elbow, patient's xray, it seems likely that he has cellulitis of the left elbow. Joint does not appear to be infected. I will send in course of doxycycline for infection and give referral for ortho follow up. However, without needle aspiration, it is impossible to fully rule out a gout flare-up. I advised  patient to take his colchicine as directed when he has known gout flare-ups, and then ortho can decide need for aspiration. All imaging discussed with patient. All questions asked and answered. Pt is understanding and d/c home in good condition.  Final Clinical Impression(s) / ED Diagnoses Final diagnoses:  Cellulitis of left elbow    Rx / DC Orders ED Discharge Orders          Ordered    doxycycline (VIBRAMYCIN) 100 MG capsule  2 times daily        08/03/21 [address] [place] 08/03/21 1223    Gareth Morgan, MD 08/03/21 [phone]

## 2021-12-07 ENCOUNTER — Emergency Department (HOSPITAL_COMMUNITY): Payer: Self-pay

## 2021-12-07 ENCOUNTER — Emergency Department (HOSPITAL_COMMUNITY)
Admission: EM | Admit: 2021-12-07 | Discharge: 2021-12-07 | Disposition: A | Discharge status: Home / Self Care | Payer: Self-pay | Attending: Emergency Medicine | Admitting: Emergency Medicine

## 2021-12-07 DIAGNOSIS — J4520 Mild intermittent asthma, uncomplicated: Secondary | ICD-10-CM | POA: Insufficient documentation

## 2021-12-07 DIAGNOSIS — R109 Unspecified abdominal pain: Secondary | ICD-10-CM

## 2021-12-07 DIAGNOSIS — I129 Hypertensive chronic kidney disease with stage 1 through stage 4 chronic kidney disease, or unspecified chronic kidney disease: Secondary | ICD-10-CM | POA: Insufficient documentation

## 2021-12-07 DIAGNOSIS — R1011 Right upper quadrant pain: Secondary | ICD-10-CM | POA: Insufficient documentation

## 2021-12-07 DIAGNOSIS — Z79899 Other long term (current) drug therapy: Secondary | ICD-10-CM | POA: Insufficient documentation

## 2021-12-07 DIAGNOSIS — N183 Chronic kidney disease, stage 3 unspecified: Secondary | ICD-10-CM | POA: Insufficient documentation

## 2021-12-07 DIAGNOSIS — M545 Low back pain, unspecified: Secondary | ICD-10-CM | POA: Insufficient documentation

## 2021-12-07 LAB — BASIC METABOLIC PANEL
Anion gap: 6 (ref 5–15)
BUN: 14 mg/dL (ref 6–20)
CO2: 28 mmol/L (ref 22–32)
Calcium: 9.3 mg/dL (ref 8.9–10.3)
Chloride: 107 mmol/L (ref 98–111)
Creatinine, Ser: 1.49 mg/dL — ABNORMAL HIGH (ref 0.61–1.24)
GFR, Estimated: 53 mL/min — ABNORMAL LOW (ref >60)
Glucose, Bld: 103 mg/dL — ABNORMAL HIGH (ref 70–99)
Potassium: 3.7 mmol/L (ref 3.5–5.1)
Sodium: 141 mmol/L (ref 135–145)

## 2021-12-07 LAB — URINALYSIS, ROUTINE W REFLEX MICROSCOPIC
Bilirubin Urine: NEGATIVE
Glucose, UA: NEGATIVE mg/dL
Hgb urine dipstick: NEGATIVE
Ketones, ur: NEGATIVE mg/dL
Leukocytes,Ua: NEGATIVE
Nitrite: NEGATIVE
Protein, ur: NEGATIVE mg/dL
Specific Gravity, Urine: 1.017 (ref 1.005–1.030)
pH: 6 (ref 5.0–8.0)

## 2021-12-07 LAB — CBC
HCT: 42.6 % (ref 39.0–52.0)
Hemoglobin: 14.2 g/dL (ref 13.0–17.0)
MCH: 31.3 pg (ref 26.0–34.0)
MCHC: 33.3 g/dL (ref 30.0–36.0)
MCV: 93.8 fL (ref 80.0–100.0)
Platelets: 189 10*3/uL (ref 150–400)
RBC: 4.54 MIL/uL (ref 4.22–5.81)
RDW: 13.8 % (ref 11.5–15.5)
WBC: 5.3 10*3/uL (ref 4.0–10.5)
nRBC: 0 % (ref 0.0–0.2)

## 2021-12-07 LAB — HEPATIC FUNCTION PANEL
ALT: 15 U/L (ref 0–44)
AST: 19 U/L (ref 15–41)
Albumin: 3.8 g/dL (ref 3.5–5.0)
Alkaline Phosphatase: 44 U/L (ref 38–126)
Bilirubin, Direct: 0.2 mg/dL (ref 0.0–0.2)
Indirect Bilirubin: 0.9 mg/dL (ref 0.3–0.9)
Total Bilirubin: 1.1 mg/dL (ref 0.3–1.2)
Total Protein: 7.5 g/dL (ref 6.5–8.1)

## 2021-12-07 LAB — LIPASE, BLOOD: Lipase: 39 U/L (ref 11–51)

## 2021-12-07 MED ORDER — MORPHINE SULFATE (PF) 4 MG/ML IV SOLN
4.0000 mg | Freq: Once | INTRAVENOUS | Status: AC
  Administered 2021-12-07: 4 mg via INTRAMUSCULAR
  Filled 2021-12-07: qty 1

## 2021-12-07 MED ORDER — OXYCODONE-ACETAMINOPHEN 5-325 MG PO TABS
1.0000 | ORAL_TABLET | Freq: Once | ORAL | Status: AC
  Administered 2021-12-07: 1 via ORAL
  Filled 2021-12-07: qty 1

## 2021-12-07 MED ORDER — HYDROCODONE-ACETAMINOPHEN 5-325 MG PO TABS: 1.0000 | ORAL_TABLET | Freq: Four times a day (QID) | ORAL | 0 refills | Status: AC | PRN

## 2021-12-07 NOTE — ED Provider Notes (Signed)
Patient care signed out to follow-up ultrasound results.  Patient presented with abdominal and flank pain had CT stone study negative for any acute findings or kidney stones.  Patient persistent pain so ultrasound was ordered to look for gallstones.  Ultrasound results reviewed no acute findings no gallstones.  Patient's pain improved in the ER.  Patient stable for outpatient follow-up with primary doctor and reasons to return discussed. ? ?[name] ? ?  ?Blane Ohara, MD ?12/09/21 2339 ? ?

## 2021-12-07 NOTE — Discharge Instructions (Addendum)
Follow-up closely with your primary doctor.  Return for new or worsening signs or symptoms. ?Use Tylenol every 4 hours and ibuprofen every 6 as needed for pain. ?For severe pain take norco or vicodin however realize they have the potential for addiction and it can make you sleepy and has tylenol in it.  No operating machinery while taking. ? ?

## 2021-12-07 NOTE — ED Provider Notes (Signed)
?[facility] ?Provider Note ? ? ?CSN: [id number] ?Arrival date & time: 12/07/21  0909 ? ?  ? ?History ? ?Chief Complaint  ?Patient presents with  ? Abdominal Pain  ? Flank Pain  ? ? ?[name] is a 61 y.o. male. ? ?HPI ? ?  ? ?Since yesterday had right sided pain right back and right hip ?Dozed off and pain woke him up yesterday ?Sharp pain ?Right lower back ?Just there ?No history of pain like that ?Difficulty moving and walking, worse with movement ?First was a throbbing pain, then sharp ?With sitting or laying down is there ?Not worse with eating or drinking ?Tried tylenol, took 3 last night  ?No recent heavy lifting or injury ?No fevers, chills, nausea, vomiting, diarrhea, hematuria, dysuria  ?Has had some constipation ?Had hx of abdominal laparoscopy 1984 ?Gout improved but pain started ?No loss of control of bowel, bladder  ? ?Past Medical History:  ?Diagnosis Date  ? BPH (benign prostatic hyperplasia)   ? CKD (chronic kidney disease), stage III ([facility])   ? pt states he's unaware  ? Gout   ? History of CVA in adulthood   ? 09-20-2010  acute right superior cerebellar infarct/ pt denies deficits  ? HTN (hypertension)   ? no meds x2 mos  ? Hyperlipidemia   ? Incomplete rotator cuff tear   ? left shoulder  ? Mild intermittent asthma   ? OA (osteoarthritis)   ? AC  joint left shoulder   ? PFO (patent foramen ovale)   ? small patent PFO w/ left to right flow per TEE 12-09-2010  ? Renal cyst, right   ? simple per ultrasound 11-19-2015  ? Shoulder impingement, left   ? Sleep apnea   ?  ? ?Home Medications ?Prior to Admission medications   ?Medication Sig Start Date End Date Taking? Authorizing Provider  ?HYDROcodone-acetaminophen (NORCO/VICODIN) 5-325 MG tablet Take 1-2 tablets by mouth every 6 (six) hours as needed for severe pain. 12/07/21  Yes Elnora Morrison, MD  ?albuterol (PROVENTIL HFA) 108 (90 BASE) MCG/ACT inhaler Inhale 2 puffs into the lungs every 6 (six) hours as  needed for wheezing or shortness of breath.  10/20/13   [provider]  ?allopurinol (ZYLOPRIM) 300 MG tablet Take 300 mg by mouth 1 day or 1 dose. 04/27/19   [provider]  ?amLODipine (NORVASC) 10 MG tablet Take by mouth. 04/07/18 04/07/19  [provider]  ?atorvastatin (LIPITOR) 40 MG tablet Take by mouth. 11/26/17   [provider]  ?colchicine 0.6 MG tablet Take 2 tablets followed by 1 tablet 1 hour later as needed for gout attack.  May repeat every 3 days until gout attack resolves. 06/18/18   Molpus, John, MD  ?cyclobenzaprine (FLEXERIL) 10 MG tablet Take 0.5 tablets (5 mg total) by mouth 2 (two) times daily as needed for muscle spasms. 02/26/19   Lucrezia Starch, MD  ?doxycycline (VIBRAMYCIN) 100 MG capsule Take 1 capsule (100 mg total) by mouth 2 (two) times daily. 08/03/21   Tonye Pearson, PA-C  ?HYDROcodone-acetaminophen (NORCO) 5-325 MG tablet Take 1 tablet by mouth every 6 (six) hours as needed (for pain). 06/18/18   Molpus, John, MD  ?losartan (COZAAR) 25 MG tablet Take 25 mg by mouth daily. 09/21/17   [provider]  ?naproxen (NAPROSYN) 500 MG tablet Take 1 tablet (500 mg total) by mouth 2 (two) times daily. 03/01/18   Horton, Barbette Hair, MD  ?   ? ?  Allergies    ?Allopurinol, Eggs or egg-derived products, and Other   ? ?Review of Systems   ?Review of Systems ? ?Physical Exam ?Updated Vital Signs ?BP (!) 147/84   Pulse (!) 58   Temp 98.2 ?F (36.8 ?C) (Oral)   Resp 15   SpO2 96%  ?Physical Exam ?Vitals and nursing note reviewed.  ?Constitutional:   ?   General: He is not in acute distress. ?   Appearance: He is well-developed. He is not diaphoretic.  ?HENT:  ?   Head: Normocephalic and atraumatic.  ?Eyes:  ?   Conjunctiva/sclera: Conjunctivae normal.  ?Cardiovascular:  ?   Rate and Rhythm: Normal rate and regular rhythm.  ?   Heart sounds: Normal heart sounds. No murmur heard. ?  No friction rub. No gallop.  ?Pulmonary:  ?   Effort: Pulmonary effort is  normal. No respiratory distress.  ?   Breath sounds: Normal breath sounds. No wheezing or rales.  ?Abdominal:  ?   General: There is no distension.  ?   Palpations: Abdomen is soft.  ?   Tenderness: There is abdominal tenderness in the right upper quadrant. There is no guarding. Positive signs include Murphy's sign.  ?Musculoskeletal:  ?   Cervical back: Normal range of motion.  ?   Comments: Normal sensation, right leg with some slow movement at hip due to pain but normal strength  ?Skin: ?   General: Skin is warm and dry.  ?Neurological:  ?   Mental Status: He is alert and oriented to person, place, and time.  ? ? ?ED Results / Procedures / Treatments   ?Labs ?(all labs ordered are listed, but only abnormal results are displayed) ?Labs Reviewed  ?BASIC METABOLIC PANEL - Abnormal; Notable for the following components:  ?    Result Value  ? Glucose, Bld 103 (*)   ? Creatinine, Ser 1.49 (*)   ? GFR, Estimated 53 (*)   ? All other components within normal limits  ?URINALYSIS, ROUTINE W REFLEX MICROSCOPIC  ?CBC  ?HEPATIC FUNCTION PANEL  ?LIPASE, BLOOD  ? ? ?EKG ?None ? ?Radiology ?CT Renal Stone Study ? ?Result Date: 12/07/2021 ?CLINICAL DATA:  Flank pain.  Clinical suspicion for kidney stone. EXAM: CT ABDOMEN AND PELVIS WITHOUT CONTRAST TECHNIQUE: Multidetector CT imaging of the abdomen and pelvis was performed following the standard protocol without IV contrast. RADIATION DOSE REDUCTION: This exam was performed according to the departmental dose-optimization program which includes automated exposure control, adjustment of the mA and/or kV according to patient size and/or use of iterative reconstruction technique. COMPARISON:  02/26/2019. FINDINGS: Lower chest: Clear lung bases. Hepatobiliary: Liver normal in size. Decreased liver attenuation consistent with fatty infiltration. No liver mass or focal lesion. Normal gallbladder. No bile duct dilation. Pancreas: Unremarkable. No pancreatic ductal dilatation or  surrounding inflammatory changes. Spleen: Normal in size without focal abnormality. Adrenals/Urinary Tract: No adrenal masses. Two low-attenuation right renal masses, mid and upper pole, largest at the midpole, 2 cm, increased in size from the prior CT, but consistent with cysts. No other renal masses. No intrarenal stones. No hydronephrosis. Ureters are normal in course and in caliber. Bladder is unremarkable. Stomach/Bowel: Stomach is within normal limits. Appendix appears normal. No evidence of bowel wall thickening, distention, or inflammatory changes. Vascular/Lymphatic: No significant vascular findings are present. No enlarged abdominal or pelvic lymph nodes. Reproductive: Unremarkable. Other: No abdominal wall hernia or abnormality. No abdominopelvic ascites. Musculoskeletal: No fracture or acute finding.  No bone lesion. IMPRESSION: 1.  No acute findings. No renal or ureteral stones or signs of obstructive uropathy. No findings to account for flank pain. 2. Hepatic steatosis. Electronically Signed   By: Lajean Manes M.D.   On: 12/07/2021 10:25  ? ?US Abdomen Limited RUQ (LIVER/GB) ? ?Result Date: 12/07/2021 ?CLINICAL DATA:  Right-sided abdominal pain. EXAM: ULTRASOUND ABDOMEN LIMITED RIGHT UPPER QUADRANT COMPARISON:  CT scan 12/07/2021 FINDINGS: Gallbladder: No gallstones or wall thickening visualized. No sonographic Murphy sign noted by sonographer. Common bile duct: Diameter: Mild common bile duct dilatation measuring almost 10 mm. No definite common bile duct stones are identified. Liver: Normal echogenicity without focal lesion or biliary dilatation. Portal vein is patent on color Doppler imaging with normal direction of blood flow towards the liver. Other: None. IMPRESSION: 1. No gallstones or findings for acute cholecystitis. 2. Mild common bile duct dilatation of uncertain significance. Recommend correlation with liver function studies. If these are elevated MRCP may be helpful for further evaluation.  Electronically Signed   By: Marijo Sanes M.D.   On: 12/07/2021 15:38   ? ?Procedures ?Procedures  ? ? ?Medications Ordered in ED ?Medications  ?oxyCODONE-acetaminophen (PERCOCET/ROXICET) 5-325 MG per tablet 1 tablet (1 t

## 2021-12-07 NOTE — ED Triage Notes (Signed)
EMS stated, he is complaining of right sided flank pain since last night. Denies any other symptoms  ?
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
## 2022-10-31 IMAGING — CT CT RENAL STONE PROTOCOL
2 of 4 series · 12 of 46 positions shown, 14 images · non-contrast
Comparison: 02/26/2019.

CLINICAL DATA: Flank pain.  Clinical suspicion for kidney stone.



[Series 3: ap without · axial · non-contrast · 0.60mm/px · z∈[-1139,-744]mm · 9 of 94 slices shown, 11 images]
[im 10/94  soft-tissue]
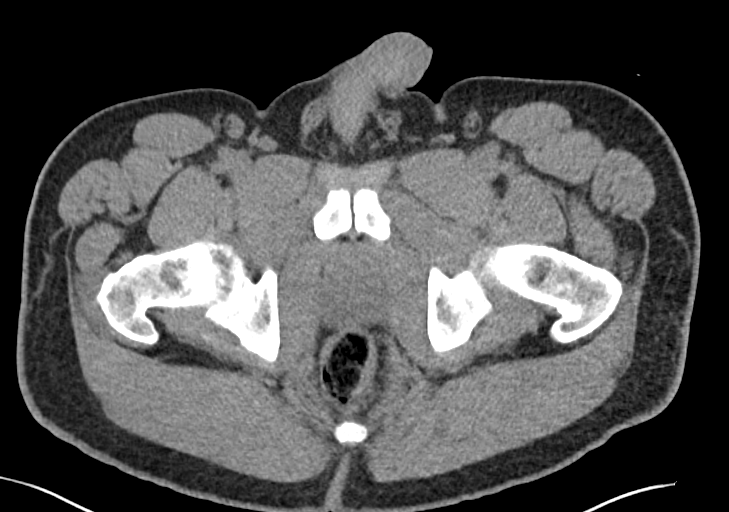
[im 10/94  bone]
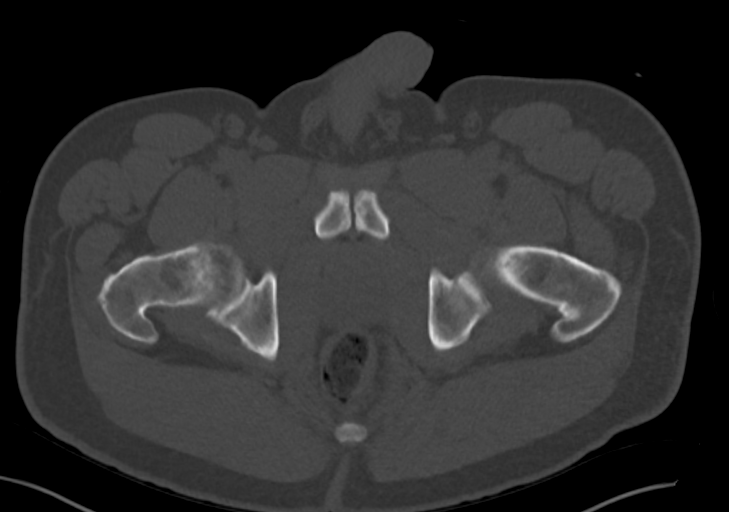
[im 20/94  soft-tissue]
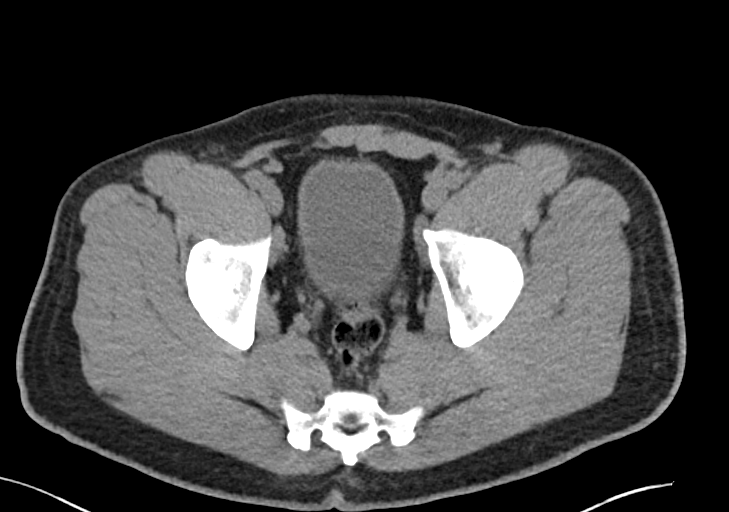
[im 30/94  soft-tissue]
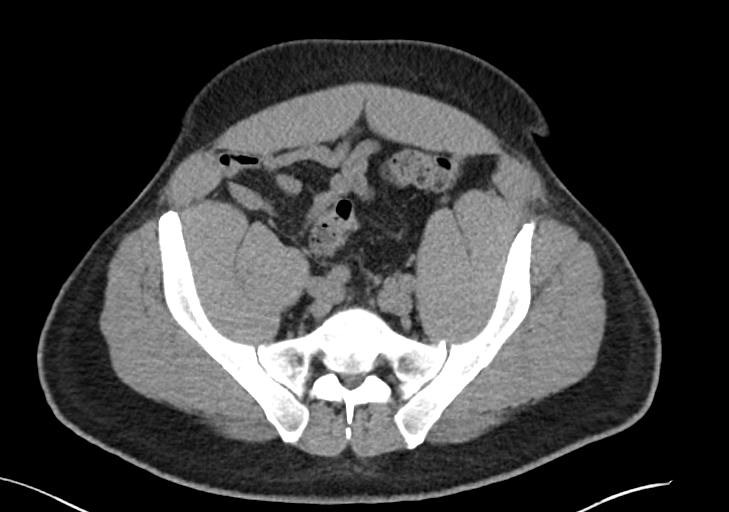
[im 40/94  soft-tissue]
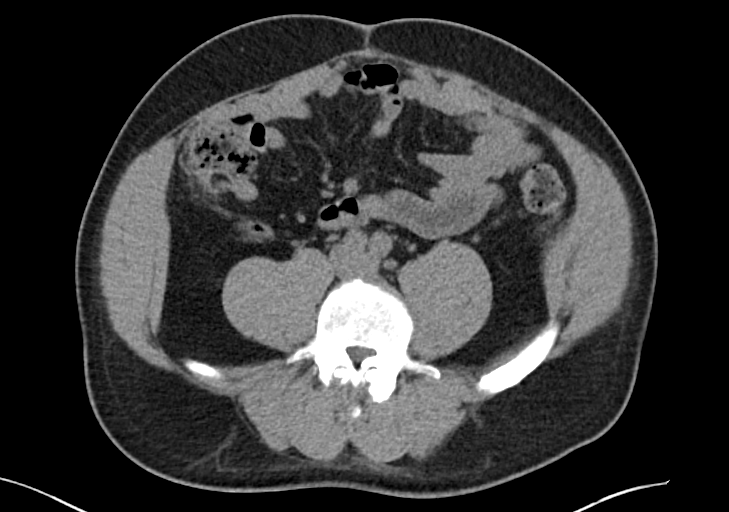
[im 49/94  soft-tissue]
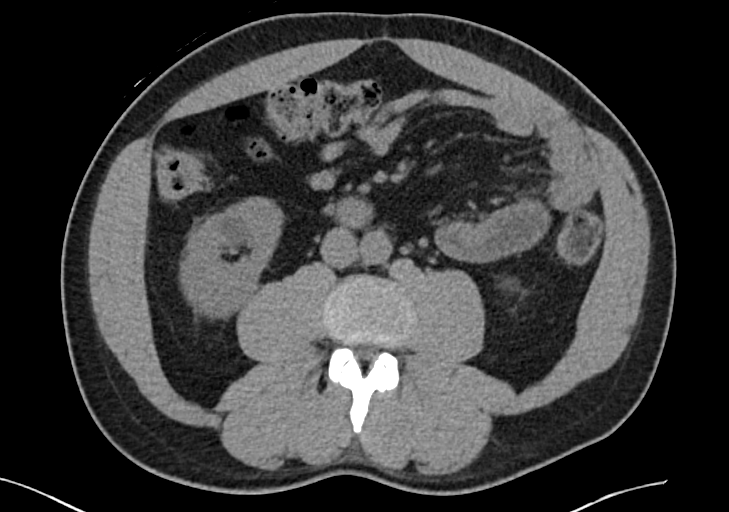
[im 59/94  soft-tissue]
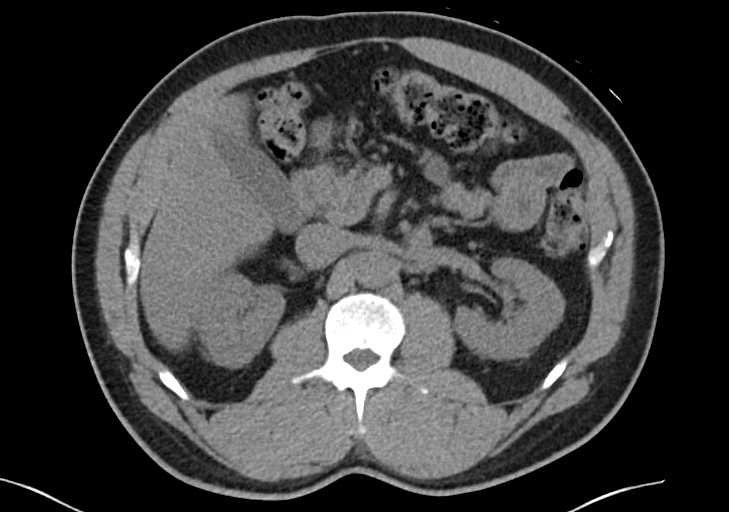
[im 69/94  soft-tissue]
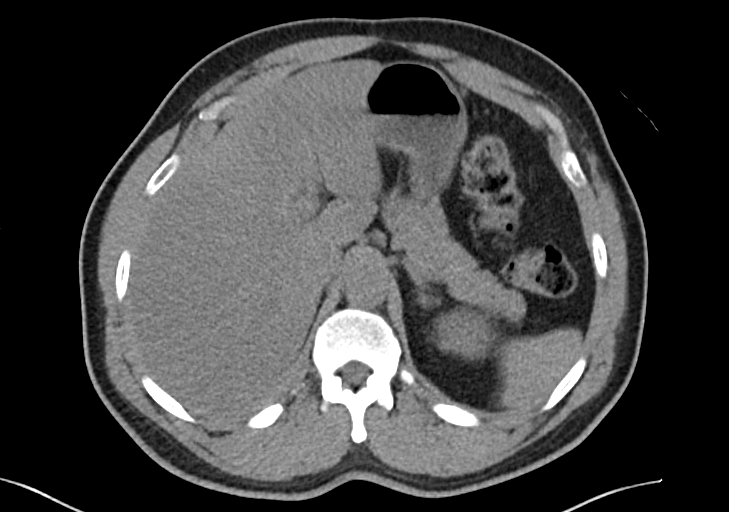
[im 79/94  soft-tissue]
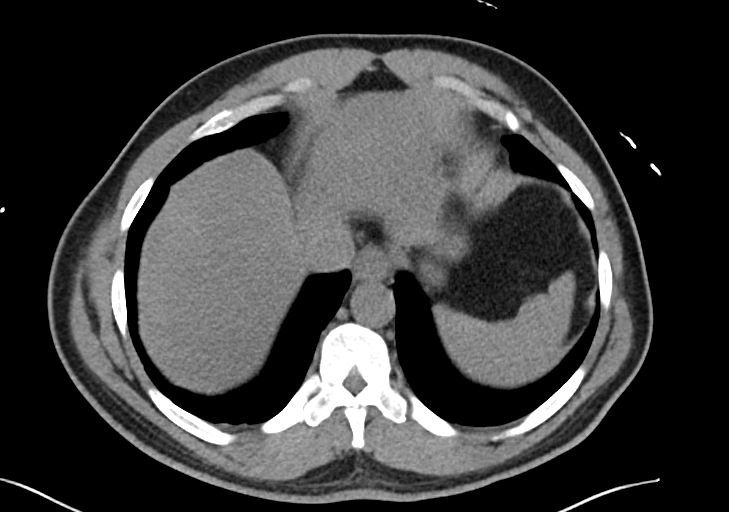
[im 89/94  soft-tissue]
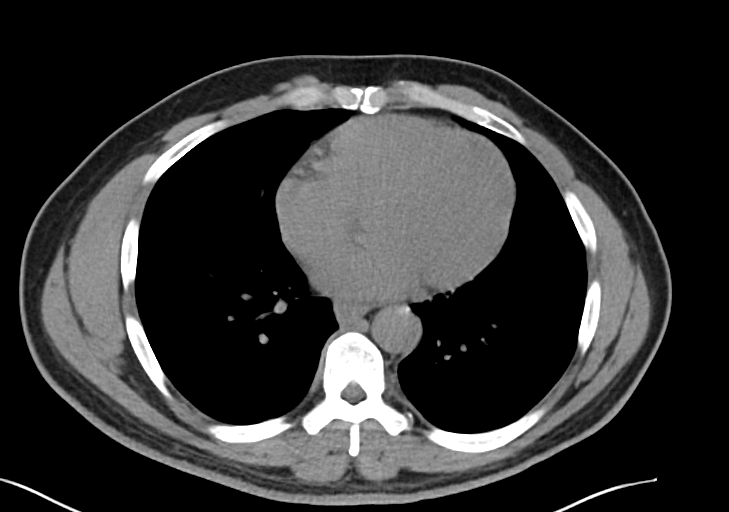
[im 89/94  bone]
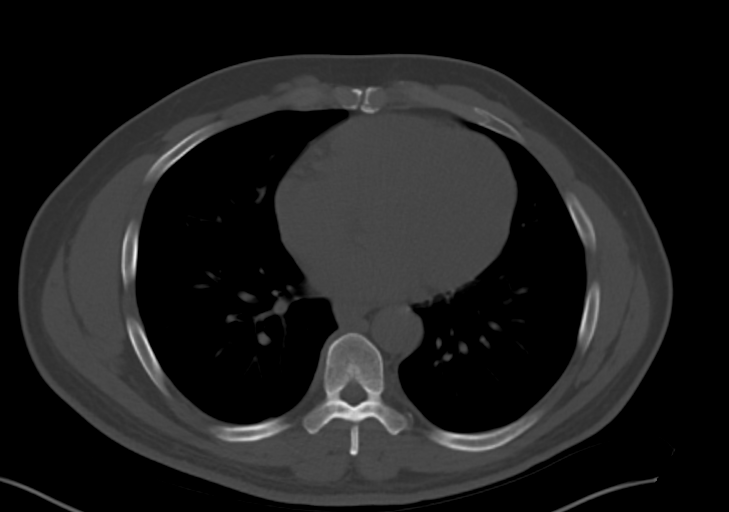

[Series 6: cor · coronal · 0.85mm/px · 3 of 103 slices shown]
[im 35/103  soft-tissue]
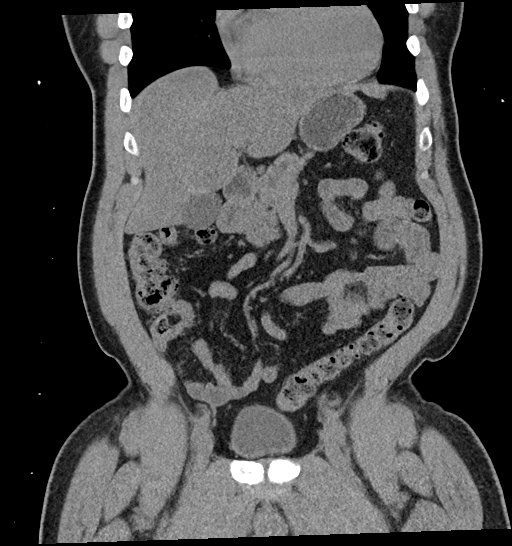
[im 46/103  soft-tissue]
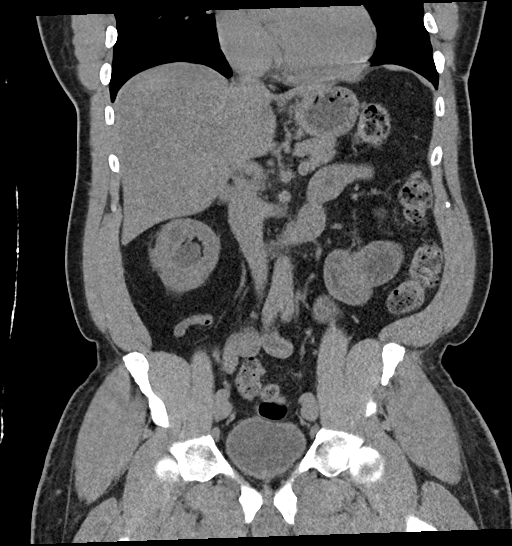
[im 57/103  soft-tissue]
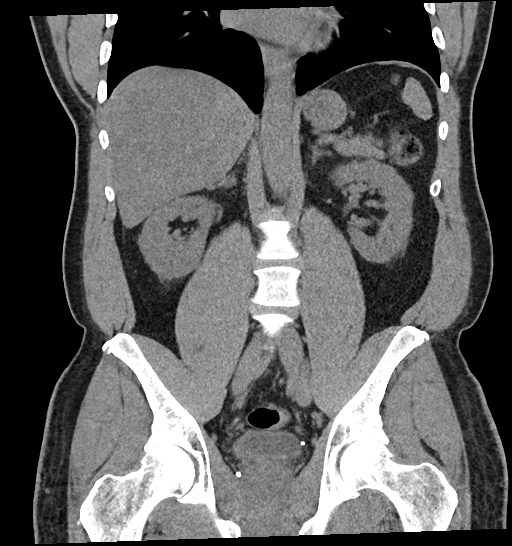

[12 of 46 positions shown; findings below may reference images not displayed]

FINDINGS: Lower chest: Clear lung bases.

Hepatobiliary: Liver normal in size. Decreased liver attenuation
consistent with fatty infiltration. No liver mass or focal lesion.
Normal gallbladder. No bile duct dilation.

Pancreas: Unremarkable. No pancreatic ductal dilatation or
surrounding inflammatory changes.

Spleen: Normal in size without focal abnormality.

Adrenals/Urinary Tract: No adrenal masses. Two low-attenuation right
renal masses, mid and upper pole, largest at the midpole, 2 cm,
increased in size from the prior CT, but consistent with cysts. No
other renal masses. No intrarenal stones. No hydronephrosis. Ureters
are normal in course and in caliber. Bladder is unremarkable.

Stomach/Bowel: Stomach is within normal limits. Appendix appears
normal. No evidence of bowel wall thickening, distention, or
inflammatory changes.

Vascular/Lymphatic: No significant vascular findings are present. No
enlarged abdominal or pelvic lymph nodes.

Reproductive: Unremarkable.

Other: No abdominal wall hernia or abnormality. No abdominopelvic
ascites.

Musculoskeletal: No fracture or acute finding.  No bone lesion.
IMPRESSION: 1. No acute findings. No renal or ureteral stones or signs of
obstructive uropathy. No findings to account for flank pain.
2. Hepatic steatosis.

## 2023-06-13 ENCOUNTER — Emergency Department (HOSPITAL_BASED_OUTPATIENT_CLINIC_OR_DEPARTMENT_OTHER): Payer: BC Managed Care – PPO

## 2023-06-13 ENCOUNTER — Emergency Department (HOSPITAL_BASED_OUTPATIENT_CLINIC_OR_DEPARTMENT_OTHER)
Admission: EM | Admit: 2023-06-13 | Discharge: 2023-06-13 | Disposition: A | Discharge status: Home / Self Care | Payer: BC Managed Care – PPO | Attending: Emergency Medicine | Admitting: Emergency Medicine

## 2023-06-13 ENCOUNTER — Other Ambulatory Visit: Payer: Self-pay

## 2023-06-13 ENCOUNTER — Encounter (HOSPITAL_BASED_OUTPATIENT_CLINIC_OR_DEPARTMENT_OTHER): Payer: Self-pay | Admitting: Emergency Medicine

## 2023-06-13 DIAGNOSIS — M25562 Pain in left knee: Secondary | ICD-10-CM | POA: Diagnosis present

## 2023-06-13 MED ORDER — OXYCODONE HCL 5 MG PO TABS
5.0000 mg | ORAL_TABLET | Freq: Once | ORAL | Status: AC
  Administered 2023-06-13: 5 mg via ORAL
  Filled 2023-06-13: qty 1

## 2023-06-13 NOTE — Discharge Instructions (Signed)
You were seen in the emergency department for left knee pain Your pain improved after 1 dose of oxycodone here in the emergency department The x-ray did not show any broken bones We applied a compressive wrap which can help with some of the discomfort and swelling Continue taking Tylenol as directed for pain at home Follow-up with your primary care doctor within 1 week If this does not get better please follow-up with orthopedics at the number listed below to make an appointment to be seen in the office Return to the Emergency Department for severe pain, if you are unable to walk or have any other concerns

## 2023-06-13 NOTE — ED Provider Notes (Signed)
[facility] Provider Note   CSN: [id number] Arrival date & time: 06/13/23  4098     History  Chief Complaint  Patient presents with   Knee Pain    [name] is a 62 y.o. male.  With a history of osteoarthritis and gout who presents to the ED for left knee pain.  He reports 4 days of severe left knee pain.  Pain is made worse with weightbearing and ambulation.  He has been able to walk although it is very painful.  He has been taking extra strength Tylenol at home which has not provided much pain relief.  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  There is been no inciting injury or trauma.  No other joint pain or complaints at this time.  Denies fevers and chills.   Knee Pain      Home Medications Prior to Admission medications   Medication Sig Start Date End Date Taking? Authorizing Provider  albuterol (PROVENTIL HFA) 108 (90 BASE) MCG/ACT inhaler Inhale 2 puffs into the lungs every 6 (six) hours as needed for wheezing or shortness of breath.  10/20/13   [provider]  allopurinol (ZYLOPRIM) 300 MG tablet Take 300 mg by mouth 1 day or 1 dose. 04/27/19   [provider]  amLODipine (NORVASC) 10 MG tablet Take by mouth. 04/07/18 04/07/19  [provider]  atorvastatin (LIPITOR) 40 MG tablet Take by mouth. 11/26/17   [provider]  colchicine 0.6 MG tablet Take 2 tablets followed by 1 tablet 1 hour later as needed for gout attack.  May repeat every 3 days until gout attack resolves. 06/18/18   Molpus, John, MD  cyclobenzaprine (FLEXERIL) 10 MG tablet Take 0.5 tablets (5 mg total) by mouth 2 (two) times daily as needed for muscle spasms. 02/26/19   Milagros Loll, MD  doxycycline (VIBRAMYCIN) 100 MG capsule Take 1 capsule (100 mg total) by mouth 2 (two) times daily. 08/03/21   Janell Quiet, PA-C  HYDROcodone-acetaminophen (NORCO)  5-325 MG tablet Take 1 tablet by mouth every 6 (six) hours as needed (for pain). 06/18/18   Molpus, John, MD  HYDROcodone-acetaminophen (NORCO/VICODIN) 5-325 MG tablet Take 1-2 tablets by mouth every 6 (six) hours as needed for severe pain. 12/07/21   Blane Ohara, MD  losartan (COZAAR) 25 MG tablet Take 25 mg by mouth daily. 09/21/17   [provider]  naproxen (NAPROSYN) 500 MG tablet Take 1 tablet (500 mg total) by mouth 2 (two) times daily. 03/01/18   Horton, Mayer Masker, MD      Allergies    Allopurinol, Egg-derived products, and Other    Review of Systems   Review of Systems  Physical Exam Updated Vital Signs BP (!) 174/100 (BP Location: Right Arm)   Pulse 71   Temp 98 F (36.7 C) (Oral)   Resp 18   Ht 6' (1.829 m)   Wt 111.1 kg   SpO2 98%   BMI 33.23 kg/m  Physical Exam Vitals and nursing note reviewed.  HENT:     Head: Normocephalic and atraumatic.  Eyes:     Pupils: Pupils are equal, round, and reactive to light.  Cardiovascular:     Rate and Rhythm: Normal rate and regular rhythm.  Pulmonary:     Effort: Pulmonary effort is normal.     Breath sounds: Normal breath sounds.  Abdominal:  Palpations: Abdomen is soft.     Tenderness: There is no abdominal tenderness.  Musculoskeletal:     Comments: Mild edema over anterior aspect of left knee with no obvious effusion No increased erythema or warmth Crepitus with knee flexion extension Painful active range of motion with flexion/extension of the left knee Normal strength at the left hip left ankle and left foot Sensation tact light touch throughout 2+ DP pulses bilaterally  Skin:    General: Skin is warm and dry.  Neurological:     Mental Status: He is alert.  Psychiatric:        Mood and Affect: Mood normal.     ED Results / Procedures / Treatments   Labs (all labs ordered are listed, but only abnormal results are displayed) Labs Reviewed - No data to display  EKG None  Radiology DG Knee 2  Views Left  Result Date: 06/13/2023 CLINICAL DATA:  62 year old male with history of left-sided knee pain and swelling for the past 4 days. EXAM: LEFT KNEE - 1-2 VIEW COMPARISON:  No priors. FINDINGS: Two views of the left knee demonstrate no acute displaced fracture or subluxation. Enthesophytes are noted extending from the tibial eminence, as well as the superior and inferior aspect of the patella, presumably chronic. Mild soft tissue swelling anterior to the patella. IMPRESSION: 1. Soft tissue swelling anterior to the patella. No underlying acute osseous abnormality. Electronically Signed   By: Trudie Reed M.D.   On: 06/13/2023 07:46    Procedures Procedures    Medications Ordered in ED Medications  oxyCODONE (Oxy IR/ROXICODONE) immediate release tablet 5 mg (5 mg Oral Given 06/13/23 0726)    ED Course/ Medical Decision Making/ A&P Clinical Course as of 06/13/23 0934  Sat Jun 13, 2023  7829 X-ray shows soft tissue swelling but no osseous abnormality.  Will discharge with Ace wrap for compressive support, crutches for ambulation and Ortho follow-up [MP]    Clinical Course User Index [MP] Lyle, Weyer, DO                                 Medical Decision Making 62 year old male with history of osteoarthritis and gout who presents to the ED for atraumatic left knee pain.  4 days of left knee pain made worse with weightbearing and ambulation.  Exam notable for some crepitus with flexion extension of the left knee.  Mild swelling with no effusion.  No erythema or increased warmth.  Differential diagnosis includes osteoarthritis, gout flare, septic arthritis, osseous injury and ligamentous injury.  Lower suspicion for acute gout flare at this time as patient states his gout flares are much more painful.  He has been compliant with allopurinol.  Most likely etiology would be acute on chronic osteoarthritis in the setting of prolonged standing at work.  Will obtain x-ray to evaluate for  osseous injury and give 1 dose of oxycodone while he is here in the department.  Could consider arthrocentesis in the future to evaluate for gout flare but there is no effusion to tap today.  Given the benign overall appearance lower suspicion for septic arthritis at this time.  Amount and/or Complexity of Data Reviewed Radiology: ordered.  Risk Prescription drug management.           Final Clinical Impression(s) / ED Diagnoses Final diagnoses:  Acute pain of left knee    Rx / DC Orders ED Discharge Orders  None         Calistro, Oelke, DO 06/13/23 8295

## 2023-06-13 NOTE — ED Triage Notes (Signed)
Pt left knee pain X 4 days, states has a standing job. Woke up worse today.
